# Patient Record
Sex: Male | Born: 2000
Health system: Southern US, Community
[De-identification: ages and names within clinical notes are randomized; demographics above are authoritative.]

## PROBLEM LIST (undated history)

## (undated) DIAGNOSIS — F900 Attention-deficit hyperactivity disorder, predominantly inattentive type: Secondary | ICD-10-CM

## (undated) DIAGNOSIS — Z91018 Allergy to other foods: Secondary | ICD-10-CM

## (undated) DIAGNOSIS — J309 Allergic rhinitis, unspecified: Secondary | ICD-10-CM

## (undated) DIAGNOSIS — J45909 Unspecified asthma, uncomplicated: Secondary | ICD-10-CM

## (undated) DIAGNOSIS — I10 Essential (primary) hypertension: Secondary | ICD-10-CM

## (undated) HISTORY — DX: Unspecified asthma, uncomplicated: J45.909

## (undated) HISTORY — DX: Attention-deficit hyperactivity disorder, predominantly inattentive type: F90.0

## (undated) HISTORY — DX: Allergy to other foods: Z91.018

## (undated) HISTORY — PX: TONSILLECTOMY: SUR1361

## (undated) HISTORY — DX: Essential (primary) hypertension: I10

## (undated) HISTORY — DX: Allergic rhinitis, unspecified: J30.9

---
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
## 2018-01-08 ENCOUNTER — Ambulatory Visit (INDEPENDENT_AMBULATORY_CARE_PROVIDER_SITE_OTHER): Payer: 59 | Admitting: Family Medicine

## 2018-01-08 ENCOUNTER — Encounter: Payer: Self-pay | Admitting: Family Medicine

## 2018-01-08 VITALS — BP 120/70 | HR 90 | Temp 98.2°F | Resp 18

## 2018-01-08 DIAGNOSIS — J301 Allergic rhinitis due to pollen: Secondary | ICD-10-CM | POA: Diagnosis not present

## 2018-01-08 DIAGNOSIS — T7800XD Anaphylactic reaction due to unspecified food, subsequent encounter: Secondary | ICD-10-CM

## 2018-01-08 DIAGNOSIS — T781XXD Other adverse food reactions, not elsewhere classified, subsequent encounter: Secondary | ICD-10-CM | POA: Diagnosis not present

## 2018-01-08 DIAGNOSIS — J4541 Moderate persistent asthma with (acute) exacerbation: Secondary | ICD-10-CM | POA: Diagnosis not present

## 2018-01-08 MED ORDER — FLUTICASONE PROPIONATE HFA 110 MCG/ACT IN AERO: 2.0000 | INHALATION_SPRAY | Freq: Two times a day (BID) | RESPIRATORY_TRACT | 5 refills | Status: DC

## 2018-01-08 MED ORDER — MONTELUKAST SODIUM 10 MG PO TABS: 10.0000 mg | ORAL_TABLET | Freq: Every day | ORAL | 5 refills | Status: DC

## 2018-01-08 NOTE — Progress Notes (Signed)
[address] [place] [address] Dept: [phone]  FOLLOW UP NOTE  Patient ID: Marvin Gates, male    DOB: 07/19/2001  Age: 17 y.o. MRN: [id number] Date of Office Visit: 01/08/2018  Assessment  Chief Complaint: Asthma  HPI Marvin Gates is an 17 year old male who presents to the clinic today for a follow up visit. He was last seen in this clinic on 10/02/2017 by Dr. Beaulah Dinning for evaluation of food allergy, allergic rhinitis, and asthma. At that time, his skin testing was positive to pecan, walnut, cashew, banana, and watermelon.   At today's visit, he reports that he began to experience a sore throat, nasal congestion, and chest tightness that started yesterday morning. He reports he thinks he had a fever last night, however, he is afebrile today in the office. He reports shortness of breath, cough with yellow sputum,  and chest tinghtness with activity and rest. He began using his ProAir inhaler yesterday. He had not been using any inhalers prior to yesterday. He continues taking montelukast 10 mg once a day.   Allergic rhinitis is reported as well controlled, until yesterday, with cetirizine 10 mg once a day as needed. He reports symptoms of thick post nasal drip and slight clear nasal drainage that began yesterday.   He continues to avoid shellfish, watermelon, and banana and has not had any accidental ingestions nor has he needed to use his EpiPen since his last visit to this office. He reports the EpiPen is up to date. He occasionally eats tree nuts with no reaction.   His current medications are listed in the chart.  Drug Allergies:  Allergies  Allergen Reactions  . Banana Itching    Mouth itches  . Other     Tree nuts, positive allergy test  . Shellfish Allergy     Positive allergy test  . Wasp Venom Swelling  . Watermelon [Citrullus Vulgaris]     Positive allergy test    Physical Exam: BP 120/70   Pulse 90   Temp 98.2 F (36.8 C) (Oral)   Resp 18   SpO2 98%      Physical Exam  Constitutional: He is oriented to person, place, and time. He appears well-developed and well-nourished.  HENT:  Head: Normocephalic and atraumatic.  Right Ear: External ear normal.  Left Ear: External ear normal.  Mouth/Throat: Oropharynx is clear and moist.  Bilateral nares slightly erythematous with clear nasal drainage noted. Pharynx normal. Ears normal. Eyes normal.   Eyes: Conjunctivae are normal.  Neck: Normal range of motion. Neck supple.  Cardiovascular: Normal rate, regular rhythm and normal heart sounds.  No murmur noted.  Pulmonary/Chest: Effort normal and breath sounds normal.  Lungs clear to auscultation.  Musculoskeletal: Normal range of motion.  Neurological: He is alert and oriented to person, place, and time.  Skin: Skin is warm and dry.  Psychiatric: He has a normal mood and affect. His behavior is normal. Judgment and thought content normal.    Diagnostics: FVC 6.37, FEV1 5.44. Predicted FVC 6.29, predicted FEV1 5.22. Spirometry is within the normal limit.   Assessment and Plan: 1. Anaphylactic shock due to food, subsequent encounter   2. Moderate persistent asthma with acute exacerbation   3. Allergic rhinitis due to pollen, unspecified seasonality   4. Pollen-food allergy, subsequent encounter     Meds ordered this encounter  Medications  . fluticasone (FLOVENT HFA) 110 MCG/ACT inhaler    Sig: Inhale 2 puffs into the lungs 2 (two)  times daily.    Dispense:  1 Inhaler    Refill:  5  . montelukast (SINGULAIR) 10 MG tablet    Sig: Take 1 tablet (10 mg total) by mouth at bedtime.    Dispense:  30 tablet    Refill:  5    Patient Instructions  Avoid tree nuts, bananas, watermelon. Avoid shellfish until I get the results of your blood work for shellfish allergy. If you have an allergic reaction take Benadryl 50 mg every 4 hours and if you have life-threatening symptoms inject with EpiPen 0.3 mg Zyrtec 10 mg once a day if needed for runny  nose or itchy eyes ProAir 2 puffs every 4 hours if needed for wheezing or coughing spells Continue montelukast 10 mg once a day at bedtime Prednisone 10 mg tablets. Take 2 tablets twice a day for 3 days, take 2 tablets for 1 day, then take 1 tablet on the 5th day, then stop. Begin Flovent 110- 2 puffs every 12 hours to prevent cough or wheeze. Call me if you are not doing well on this treatment plan I will call you with the results of your blood work for shellfish allergy Follow up in 3 months or sooner if needed   Return in about 3 months (around 04/10/2018), or if symptoms worsen or fail to improve.   Thank you for the opportunity to care for this patient.  Please do not hesitate to contact me with questions.  Thermon Leyland, FNP [facility]  I have provided oversight concerning Thermon Leyland' evaluation and treatment of this patient's health issues addressed during today's encounter. I agree with the assessment and therapeutic plan as outlined in the note.   Thank you for the opportunity to care for this patient.  Please do not hesitate to contact me with questions.  Tonette Bihari, M.D.  [facility] [address] [place], [place] [address] [phone]

## 2018-01-08 NOTE — Patient Instructions (Addendum)
Avoid tree nuts, bananas, watermelon. Avoid shellfish until I get the results of your blood work for shellfish allergy. If you have an allergic reaction take Benadryl 50 mg every 4 hours and if you have life-threatening symptoms inject with EpiPen 0.3 mg Zyrtec 10 mg once a day if needed for runny nose or itchy eyes ProAir 2 puffs every 4 hours if needed for wheezing or coughing spells Continue montelukast 10 mg once a day at bedtime Prednisone 10 mg tablets. Take 2 tablets twice a day for 3 days, take 2 tablets for 1 day, then take 1 tablet on the 5th day, then stop. Begin Flovent 110- 2 puffs every 12 hours to prevent cough or wheeze. Call me if you are not doing well on this treatment plan I will call you with the results of your blood work for shellfish allergy Follow up in 3 months or sooner if needed

## 2018-01-12 ENCOUNTER — Encounter: Payer: Self-pay | Admitting: Allergy & Immunology

## 2018-01-12 ENCOUNTER — Ambulatory Visit: Payer: 59 | Admitting: Allergy & Immunology

## 2018-01-12 VITALS — BP 140/82 | HR 88 | Temp 97.9°F | Resp 20

## 2018-01-12 DIAGNOSIS — J4541 Moderate persistent asthma with (acute) exacerbation: Secondary | ICD-10-CM

## 2018-01-12 DIAGNOSIS — T7800XD Anaphylactic reaction due to unspecified food, subsequent encounter: Secondary | ICD-10-CM | POA: Diagnosis not present

## 2018-01-12 DIAGNOSIS — J301 Allergic rhinitis due to pollen: Secondary | ICD-10-CM | POA: Diagnosis not present

## 2018-01-12 DIAGNOSIS — J029 Acute pharyngitis, unspecified: Secondary | ICD-10-CM

## 2018-01-12 MED ORDER — AMOXICILLIN 875 MG PO TABS: 875.0000 mg | ORAL_TABLET | Freq: Two times a day (BID) | ORAL | 0 refills | Status: AC

## 2018-01-12 NOTE — Progress Notes (Signed)
FOLLOW UP  Date of Service/Encounter:  01/12/18   Assessment:   Pharyngitis - possible Streptococcal  Anaphylactic shock due to food (banana, ? shellfish)  Moderate persistent asthma with acute exacerbation  Allergic rhinitis due to pollen  Plan/Recommendations:   1. Pharyngitis - You did have white exudates in the back of the throat, so we will go ahead and treat with amoxicillin twice daily for ten days.  - Increase yogurt intake while you are on the antibiotic. - Restart prednisone  (six tablets) daily for three days.  - Continue with albuterol, but increase to four puffs every 4-6 hours.  - Continue with all of your other medications.  - I did offer a strep test, but he declined. - He does have a girlfriend, therefore if there is no improvement with the amoxicillin, we could consider Gonococcal pharyngitis as an etiology.    2. Persistent asthma - Continue with montelukast  daily. - Continue with Flovent 110 mcg 2 puffs twice daily. - Continue with albuterol 4 puffs every 4-6 hours as needed.  3. Allergic rhinitis - Continue with cetirizine 10 mg daily.  4. Multiple food allergies - We did go through his food allergy list and removed ones that were clearly false positives. - He is eating tree nuts without any issues, so I removed this. - He is also eating watermelon without adverse event, therefore this was removed. - He does have reactions to bananas, therefore I left this on. - We also getting the shellfish panel to more clearly define his food allergies.  5. Return in about 1 year (around 01/13/2019).  Subjective:   Marvin Gates is a 17 y.o. male presenting today for follow up of  Chief Complaint  Patient presents with  . Asthma    lots of post nasal drainage, dark yellow sinus drainage, dark yellow mucus with cough, chills, low grade fever 99.8 last night. He did take some Aleve this morning.     MOHSEN Gates has a history of the  following: Patient Active Problem List   Diagnosis Date Noted  . Pollen-food allergy 10/02/2017  . Anaphylactic shock due to adverse food reaction 09/04/2017  . BMI 95th percentile or greater with athletic build, pediatric 05/23/2016  . Mild intermittent asthma without complication 05/09/2016  . Insect sting allergy, current reaction 05/09/2016  . History of insect sting allergy 03/29/2016  . Allergic rhinitis due to pollen 03/29/2016  . Acute non-recurrent pansinusitis 03/29/2016  . Hypertension 11/20/2015  . Moderate persistent asthma with acute exacerbation 06/15/2015  . Other allergic rhinitis 06/15/2015    History obtained from: chart review and patient and his father.  [facility] Primary Care Provider is Chapman Moss, MD.     Marvin Gates is a 17 y.o. male presenting for a sick visit.  He was last seen 4 days ago for shortness of breath, sore throat, and yellow sputum production.  At that time, he was started on a low-dose prednisone burst.  He was also started on Flovent 110 mcg 2 puffs twice daily.  He also has a history of multiple food allergies as well as allergic rhinitis.  He was continued on Zyrtec 10 mg daily as well as montelukast and milligrams.  He has a history of allergies to tree nuts, bananas, watermelon, and possibly shellfish (blood work pending).   Since the last visit, he has mostly done well.  He did start to feel better on the prednisone.  He remained on the  albuterol 2 puffs every 4-6 hours me today that he is only been using it once a day.  In any case, he started to feel bad again a couple of days ago with increased congestion, rhinorrhea, and sore throat.  He has not had a fever.  Unfortunately, they lost some of their prednisone pack, as it rolled behind the stove.  So he has not been on the prednisone for 3 days approximately.  Prior to this last week, his asthma had been under good control with Singulair daily only.  Despite his marked physical activity  as a [place], he really has no symptoms whatsoever.  There are no known sick contacts.  He would also like to get his blood work done today.  A shellfish panel was ordered on Monday, but our phlebotomist was not in.  We will get this done today.  He is very confused about his food allergy testing from February.  Evidently, it showed positives to tree nuts.  However, he eats cashews all of the time multiple times per week.  He also eats macadamia nuts stat she has an peanuts.  He does not really eat pecans and walnuts, but has eaten them on rare occasions without adverse event.  He also was positive to watermelon but eats watermelon on a regular basis.  Review of the note from February shows that he had the entire food panel done, which evidently showed quite a few false positives.  He does think that he reacted to scallops, which was slightly reactive on the skin test.  This was supposed to be clarified with the shellfish panel.  He eats cashews all of time, many times per week. He eats macadamia nuts, pistachios, and peanuts. He does not really eat pecans and walnuts, but has eaten them in the past. He eats watermelon all of the time.   Otherwise, there have been no changes to his past medical history, surgical history, family history, or social history.    Review of Systems: a 14-point review of systems is pertinent for what is mentioned in HPI.  Otherwise, all other systems were negative. Constitutional: negative other than that listed in the HPI Eyes: negative other than that listed in the HPI Ears, nose, mouth, throat, and face: negative other than that listed in the HPI Respiratory: negative other than that listed in the HPI Cardiovascular: negative other than that listed in the HPI Gastrointestinal: negative other than that listed in the HPI Genitourinary: negative other than that listed in the HPI Integument: negative other than that listed in the HPI Hematologic: negative other  than that listed in the HPI Musculoskeletal: negative other than that listed in the HPI Neurological: negative other than that listed in the HPI Allergy/Immunologic: negative other than that listed in the HPI    Objective:   Blood pressure 140/82, pulse 88, temperature 97.9 F (36.6 C), temperature source Oral, resp. rate 20, SpO2 98 %. There is no height or weight on file to calculate BMI.   Physical Exam:  General: Alert, interactive, in no acute distress. Pleasant. Breathing through his mouth.  Eyes: No conjunctival injection bilaterally, no discharge on the right, no discharge on the left and no Horner-Trantas dots present. PERRL bilaterally. EOMI without pain. No photophobia.  Ears: Right TM pearly gray with normal light reflex, Left TM pearly gray with normal light reflex, Right TM intact without perforation and Left TM intact without perforation.  Nose/Throat: External nose within normal limits and septum midline. Turbinates edematous  and pale with clear discharge. Posterior oropharynx erythematous with cobblestoning in the posterior oropharynx. Tonsils surgically absent.  He does have an ulcer on the back part of his soft palate as well as exudates in his posterior oropharynx.  There is market postnasal drip present as well. Lungs: Decreased breath sounds bilaterally without wheezing, rhonchi or rales. No increased work of breathing. CV: Normal S1/S2. No murmurs. Capillary refill <2 seconds.  Skin: Warm and dry, without lesions or rashes. Neuro:   Grossly intact. No focal deficits appreciated. Responsive to questions.  Diagnostic studies: none      Malachi Bonds, MD  [facility]

## 2018-01-12 NOTE — Patient Instructions (Addendum)
1. Pharyngitis - You did have white exudates in the back of the throat, so we will go ahead and treat with amoxicillin twice daily for ten days.  - Increase yogurt intake while you are on the antibiotic. - Restart prednisone  (six tablets) daily for three days.  - Continue with albuterol, but increase to four puffs every 4-6 hours.  - Continue with all of your other medications.   2. Return in about 1 year (around 01/13/2019).   Please inform us of any Emergency Department visits, hospitalizations, or changes in symptoms. Call us before going to the ED for breathing or allergy symptoms since we might be able to fit you in for a sick visit. Feel free to contact us anytime with any questions, problems, or concerns.  It was a pleasure to meet you and your family today!   Websites that have reliable patient information: 1. American Academy of Asthma, Allergy, and Immunology: www.aaaai.org 2. Food Allergy Research and Education (FARE): foodallergy.org 3. Mothers of Asthmatics: http://www.asthmacommunitynetwork.org 4. American College of Allergy, Asthma, and Immunology: www.acaai.org

## 2018-01-20 LAB — ALLERGEN PROFILE, SHELLFISH
CLAM IGE: 0.28 kU/L — AB
F023-IgE Crab: 0.22 kU/L — AB
F080-IgE Lobster: 0.12 kU/L — AB
F290-IgE Oyster: 0.41 kU/L — AB
SCALLOP IGE: 0.42 kU/L — AB
SHRIMP IGE: 0.16 kU/L — AB

## 2018-02-07 ENCOUNTER — Encounter: Payer: Self-pay | Admitting: Family Medicine

## 2018-02-07 ENCOUNTER — Ambulatory Visit (INDEPENDENT_AMBULATORY_CARE_PROVIDER_SITE_OTHER): Payer: 59 | Admitting: Family Medicine

## 2018-02-07 VITALS — BP 132/70 | HR 82 | Temp 98.0°F | Ht 76.0 in | Wt 298.2 lb

## 2018-02-07 DIAGNOSIS — Z0000 Encounter for general adult medical examination without abnormal findings: Secondary | ICD-10-CM

## 2018-02-07 DIAGNOSIS — Z23 Encounter for immunization: Secondary | ICD-10-CM | POA: Diagnosis not present

## 2018-02-07 DIAGNOSIS — Z130 Encounter for screening for diseases of the blood and blood-forming organs and certain disorders involving the immune mechanism: Secondary | ICD-10-CM

## 2018-02-07 LAB — LIPID PANEL
CHOL/HDL RATIO: 7
Cholesterol: 207 mg/dL — ABNORMAL HIGH (ref 0–200)
HDL: 28.2 mg/dL — ABNORMAL LOW (ref >39.00)
NONHDL: 179.04
Triglycerides: 270 mg/dL — ABNORMAL HIGH (ref 0.0–149.0)
VLDL: 54 mg/dL — ABNORMAL HIGH (ref 0.0–40.0)

## 2018-02-07 LAB — COMPREHENSIVE METABOLIC PANEL
ALK PHOS: 64 U/L (ref 52–171)
ALT: 16 U/L (ref 0–53)
AST: 15 U/L (ref 0–37)
Albumin: 4.7 g/dL (ref 3.5–5.2)
BILIRUBIN TOTAL: 0.6 mg/dL (ref 0.3–1.2)
BUN: 16 mg/dL (ref 6–23)
CO2: 30 meq/L (ref 19–32)
Calcium: 10 mg/dL (ref 8.4–10.5)
Chloride: 101 mEq/L (ref 96–112)
Creatinine, Ser: 1.02 mg/dL (ref 0.40–1.50)
GFR: 100.43 mL/min (ref >60.00)
Glucose, Bld: 97 mg/dL (ref 70–99)
POTASSIUM: 4.5 meq/L (ref 3.5–5.1)
SODIUM: 138 meq/L (ref 135–145)
TOTAL PROTEIN: 6.8 g/dL (ref 6.0–8.3)

## 2018-02-07 LAB — LDL CHOLESTEROL, DIRECT: Direct LDL: 144 mg/dL

## 2018-02-07 NOTE — Progress Notes (Signed)
Chief Complaint  Patient presents with  . New Patient (Initial Visit)    Well Male Marvin Gates is here for a complete physical.   His last physical was >1 year ago.  Current diet: in general, a "healthy" diet.   Current exercise: lifting weights, conditioning for football Weight trend: lost a little Seat belt? Yes.    Health maintenance Tetanus- unsure  Past Medical History:  Diagnosis Date  . Allergic rhinitis   . Asthma   . Food allergy      Past Surgical History:  Procedure Laterality Date  . TONSILLECTOMY      Medications  Current Outpatient Medications on File Prior to Visit  Medication Sig Dispense Refill  . albuterol (PROAIR HFA) 108 (90 Base) MCG/ACT inhaler Inhale 2 puffs into the lungs every 4 (four) hours as needed for wheezing or shortness of breath. 2 Inhaler 1  . cetirizine (ZYRTEC) 10 MG tablet Take 10 mg by mouth daily.    . enalapril (VASOTEC) 10 MG tablet Take 10 mg by mouth daily.    Marland Kitchen. EPINEPHrine (EPIPEN 2-PAK) 0.3 mg/0.3 mL IJ SOAJ injection Use as directed for severe allergic reaction 4 Device 2  . montelukast (SINGULAIR) 10 MG tablet Take 1 tablet (10 mg total) by mouth at bedtime. 30 tablet 5   Allergies Allergies  Allergen Reactions  . Banana Itching    Mouth itches  . Shellfish Allergy     Positive allergy test  . Wasp Venom Swelling   Family History Family History  Problem Relation Age of Onset  . Asthma Mother   . Allergic rhinitis Mother   . Food Allergy Mother        shellfish allergy  . Angioedema Neg Hx   . Eczema Neg Hx   . Immunodeficiency Neg Hx   . Urticaria Neg Hx    Review of Systems: Constitutional: no fevers or chills Eye:  no recent significant change in vision Ear/Nose/Mouth/Throat:  Ears:  no tinnitus or hearing loss Nose/Mouth/Throat:  no complaints of nasal congestion, no sore throat Cardiovascular:  no chest pain, no palpitations Respiratory:  no cough and no shortness of breath Gastrointestinal:  no  abdominal pain, no change in bowel habits GU:  Male: negative for dysuria, frequency, and incontinence and negative for prostate symptoms Musculoskeletal/Extremities:  no pain, redness, or swelling of the joints Integumentary (Skin/Breast):  no abnormal skin lesions reported Neurologic:  no headaches, no numbness, tingling Endocrine: No unexpected weight changes Hematologic/Lymphatic:  no night sweats  Exam BP 132/70 (BP Location: Left Arm, Patient Position: Sitting, Cuff Size: Large)   Pulse 82   Temp 98 F (36.7 C) (Oral)   Ht 6\' 4"  (1.93 m)   Wt 298 lb 4 oz (135.3 kg)   SpO2 97%   BMI 36.30 kg/m  General:  well developed, well nourished, in no apparent distress Skin:  no significant moles, warts, or growths Head:  no masses, lesions, or tenderness Eyes:  pupils equal and round, sclera anicteric without injection Ears:  canals without lesions, TMs shiny without retraction, no obvious effusion, no erythema Nose:  nares patent, septum midline, mucosa normal Throat/Pharynx:  lips and gingiva without lesion; tongue and uvula midline; non-inflamed pharynx; no exudates or postnasal drainage Neck: neck supple without adenopathy, thyromegaly, or masses Lungs:  clear to auscultation, breath sounds equal bilaterally, no respiratory distress Cardio:  regular rate and rhythm, no bruits, no LE edema Abdomen:  abdomen soft, nontender; bowel sounds normal; no masses or organomegaly  Genital (male): Uncircumcised penis, no lesions or discharge; testes present bilaterally without masses or tenderness Rectal: Deferred Musculoskeletal:  symmetrical muscle groups noted without atrophy or deformity Extremities:  no clubbing, cyanosis, or edema, no deformities, no skin discoloration Neuro:  gait normal; deep tendon reflexes normal and symmetric Psych: well oriented with normal range of affect and appropriate judgment/insight  Assessment and Plan  Well adult exam - Plan: Lipid panel, Comprehensive  metabolic panel  Need for Tdap vaccination - Plan: Tdap vaccine greater than or equal to 7yo IM  Need for HPV vaccination - Plan: HPV 9-valent vaccine,Recombinat  Need for meningitis vaccination - Plan: Meningococcal MCV4O(Menveo)  Encounter for sickle-cell screening - Plan: Sickle Cell Scr   Well 17 y.o. male. Counseled on diet and exercise. Doing well.  Form for sports team filled out, cleared w/o restrictions. Other orders as above. Update some imms, per dad, he had all his childhood vaccinations. Will await records.  Follow up in 1 year pending the above workup. The patient voiced understanding and agreement to the plan.  Jilda Roche Hastings, DO 02/07/18 11:56 AM

## 2018-02-07 NOTE — Progress Notes (Signed)
Pre visit review using our clinic review tool, if applicable. No additional management support is needed unless otherwise documented below in the visit note. 

## 2018-02-07 NOTE — Patient Instructions (Signed)
Stay active, keep the diet clean.  Give us 2-3 business days to get the results of your labs back. If labs are normal, you will likely receive a letter in the mail unless you have MyChart. This can take longer than 2-3 business days.   We have made a copy of your sports physical form in case anything happens.  Once we get your records, if anything needs to be updated regarding your immunizations, we will reach out to you.  Let us know if you need anything.

## 2018-02-08 ENCOUNTER — Other Ambulatory Visit: Payer: Self-pay | Admitting: Family Medicine

## 2018-02-08 DIAGNOSIS — H6123 Impacted cerumen, bilateral: Secondary | ICD-10-CM | POA: Diagnosis not present

## 2018-02-08 DIAGNOSIS — E785 Hyperlipidemia, unspecified: Secondary | ICD-10-CM

## 2018-02-08 LAB — SICKLE CELL SCREEN: SICKLE SOLUBILITY TEST - HGBRFX: NEGATIVE

## 2018-02-15 ENCOUNTER — Other Ambulatory Visit: Payer: Self-pay

## 2018-02-15 ENCOUNTER — Telehealth: Payer: Self-pay

## 2018-02-15 MED ORDER — PREDNISONE 10 MG PO TABS: 10.0000 mg | ORAL_TABLET | Freq: Every day | ORAL | 0 refills | Status: DC

## 2018-02-15 NOTE — Telephone Encounter (Signed)
Sent in rx and called and informed pt

## 2018-02-15 NOTE — Telephone Encounter (Signed)
Patient's mom called to advise that Marvin Gates had an allergic reaction just now. He consumed crab and immediately began having throat tightness, itching, sneezing, sinus congestion. Mom gave him benadryl as directed and he started feeling better. Still having some residual sinus congestion, and feels like he has just come down with a terrible cold. I advised to give 2 more benadryl and if no better to use EpiPen then seek medical attention immediately. Mom wondered if there was anything that we could send in to help him out. [place]. [facility] [phone](phone number for pharmacy)

## 2018-02-15 NOTE — Telephone Encounter (Signed)
Please call in prednisone 20mg  today, tomorrow, and the next day.

## 2018-02-19 ENCOUNTER — Telehealth: Payer: Self-pay

## 2018-02-19 NOTE — Telephone Encounter (Signed)
Mom called and stated that patient had a reaction last week to seafood and prednisone was sent in and now patient is experiencing some wheezing and coughing up yellow stuff. An appt was offered for this morning however mom stated they were on their way to [place] and could not make it this morning. A 130 appt was offered and mom stated she could not get him here by then that it would have to be later in the day before she could get him back. I advised mom that we didn't have any openings in the time frame she was looking for and asked if patient was taking his Flovent 2 puffs bid and mom stated yes and I asked if he had done his rescue inhaler and mom stated yes he finally use it a short time ago and I advised mom for patient to continue to use his inhalers and informed her that we have appts tomorrow if she would like one of those or if she wanted to take him to an urgent care if it could not wait. Mom stated that she was heading to [place] and thought it could wait until tomorrow and wanted to be seen in [place] unless there wasn't any appt available then [place] would come [place]. Patient has appt tomorrow at 2:45 in [place].

## 2018-02-20 ENCOUNTER — Ambulatory Visit: Payer: 59 | Admitting: Pediatrics

## 2018-02-20 ENCOUNTER — Encounter: Payer: Self-pay | Admitting: Pediatrics

## 2018-02-20 VITALS — BP 128/88 | HR 84 | Temp 98.4°F | Resp 20

## 2018-02-20 DIAGNOSIS — T7800XD Anaphylactic reaction due to unspecified food, subsequent encounter: Secondary | ICD-10-CM | POA: Diagnosis not present

## 2018-02-20 DIAGNOSIS — J0100 Acute maxillary sinusitis, unspecified: Secondary | ICD-10-CM

## 2018-02-20 DIAGNOSIS — J301 Allergic rhinitis due to pollen: Secondary | ICD-10-CM | POA: Diagnosis not present

## 2018-02-20 DIAGNOSIS — J4540 Moderate persistent asthma, uncomplicated: Secondary | ICD-10-CM | POA: Diagnosis not present

## 2018-02-20 MED ORDER — AMOXICILLIN-POT CLAVULANATE 875-125 MG PO TABS
ORAL_TABLET | ORAL | 0 refills | Status: DC
Start: 2018-02-20 — End: 2018-08-27

## 2018-02-20 NOTE — Patient Instructions (Addendum)
Cetirizine 10 mg-take 1 tablet once a day for runny nose Fluticasone  2 sprays per nostril once a day for stuffy nose Flovent 110 - 2 puffs twice a day to prevent coughing or wheezing Montelukast 10 mg - 1 tablet once a day to prevent coughing or wheezing Pro-air-2 puffs every 4 hours if needed for wheezing or coughing spells Augmentin 875 mg-take 1 tablet every 12 hours for infection for 10 days Prednisone 20 mg twice a day for 3 days, 20 mg on the fourth day, 10 mg on the fifth day Call us if he is not doing well on this treatment plan  Avoid shellfish and bananas.  If he has an allergic reaction take Benadryl 50 mg every 4 hours and if he has life-threatening symptoms inject with EpiPen 0.3 mg

## 2018-02-20 NOTE — Progress Notes (Signed)
  [address] [place] [address] Dept: [phone]  FOLLOW UP NOTE  Patient ID: Marvin Gates, male    DOB: 08/24/2001  Age: 17 y.o. MRN: [id number] Date of Office Visit: 02/20/2018  Assessment  Chief Complaint: Sinusitis and Allergic Reaction (10 days ago to crab)  HPI Marvin HuntsmanJacob D Gates presents for evaluation of worsening sinus problems after he had an allergic reaction to crab on 02/10/2018.  He developed swelling of his throat and shortness of breath within 30 minutes of eating crab.  He is allergic to shellfish.  He avoids bananas and shellfish.  His asthma has been well controlled.  He has developed sinus pressure, headache, and  green drainage from his sinuses   Drug Allergies:  Allergies  Allergen Reactions  . Banana Itching    Mouth itches  . Shellfish Allergy     Positive allergy test  . Wasp Venom Swelling    Physical Exam: BP 128/88   Pulse 84   Temp 98.4 F (36.9 C) (Oral)   Resp 20   SpO2 97%    Physical Exam  Constitutional: He is oriented to person, place, and time. He appears well-developed and well-nourished.  HENT:  Eyes normal.  Ears normal.  Nose mild swelling of the nasal turbinates Pharynx normal except for a green postnasal drainage  Neck: Neck supple.  Cardiovascular:  S1-S2 normal no murmurs  Pulmonary/Chest:  Clear to percussion and auscultation  Lymphadenopathy:    He has no cervical adenopathy.  Neurological: He is alert and oriented to person, place, and time.  Psychiatric: He has a normal mood and affect. His behavior is normal. Judgment and thought content normal.  Vitals reviewed.   Diagnostics: FVC 6.50 L FEV1 5.63 L.  Predicted FVC 6.29 L predicted FEV1 5.22 L-the spirometry is in the normal range  Assessment and Plan: 1. Anaphylactic shock due to food, subsequent encounter   2. Moderate persistent asthma without complication   3. Acute maxillary sinusitis, recurrence not specified   4. Seasonal allergic rhinitis due to  pollen     Meds ordered this encounter  Medications  . amoxicillin-clavulanate (AUGMENTIN) 875-125 MG tablet    Sig: Take 1 tablet every 12 hours for 10 days for infection    Dispense:  20 tablet    Refill:  0    Patient Instructions  Cetirizine 10 mg-take 1 tablet once a day for runny nose Fluticasone  2 sprays per nostril once a day for stuffy nose Flovent 110 - 2 puffs twice a day to prevent coughing or wheezing Montelukast 10 mg - 1 tablet once a day to prevent coughing or wheezing Pro-air-2 puffs every 4 hours if needed for wheezing or coughing spells Augmentin 875 mg-take 1 tablet every 12 hours for infection for 10 days Prednisone 20 mg twice a day for 3 days, 20 mg on the fourth day, 10 mg on the fifth day Call us if he is not doing well on this treatment plan  Avoid shellfish and bananas.  If he has an allergic reaction take Benadryl 50 mg every 4 hours and if he has life-threatening symptoms inject with EpiPen 0.3 mg    Return in about 6 months (around 08/22/2018).    Thank you for the opportunity to care for this patient.  Please do not hesitate to contact me with questions.  Tonette BihariJ. A. Bardelas, M.D.  [facility] [address] [place], [place] [address] [phone]

## 2018-02-21 ENCOUNTER — Telehealth: Payer: Self-pay | Admitting: Family Medicine

## 2018-02-21 ENCOUNTER — Other Ambulatory Visit: Payer: Self-pay | Admitting: Allergy

## 2018-02-21 ENCOUNTER — Other Ambulatory Visit (INDEPENDENT_AMBULATORY_CARE_PROVIDER_SITE_OTHER): Payer: 59

## 2018-02-21 DIAGNOSIS — E785 Hyperlipidemia, unspecified: Secondary | ICD-10-CM | POA: Diagnosis not present

## 2018-02-21 LAB — LIPID PANEL
CHOL/HDL RATIO: 6
Cholesterol: 225 mg/dL — ABNORMAL HIGH (ref 0–200)
HDL: 37.9 mg/dL — ABNORMAL LOW (ref >39.00)
LDL CALC: 161 mg/dL — AB (ref 0–99)
NonHDL: 187.03
TRIGLYCERIDES: 128 mg/dL (ref 0.0–149.0)
VLDL: 25.6 mg/dL (ref 0.0–40.0)

## 2018-02-21 MED ORDER — ALBUTEROL SULFATE HFA 108 (90 BASE) MCG/ACT IN AERS: 2.0000 | INHALATION_SPRAY | RESPIRATORY_TRACT | 0 refills | Status: DC | PRN

## 2018-02-21 NOTE — Telephone Encounter (Signed)
Called left message to call back 

## 2018-02-21 NOTE — Addendum Note (Signed)
Addended by: Berna BueWHITAKER, CARRIE L on: 02/21/2018 08:09 AM   Modules accepted: Orders

## 2018-02-21 NOTE — Telephone Encounter (Signed)
Copied from CRM [id number]. Topic: General - Other >> Feb 20, 2018  3:16 PM Terisa Starraylor, Brittany L wrote: Reason for CRM: Patient's mom needs a copy of his immunizations for his college records. She contacted his previous his pediatrician office and they told her they already sent this to Dr Carmelia RollerWendling. Please call when that is ready for pick up. She needs this by Friday. [phone]   Printed report from [facility]/immunization report in chart as well///Is in an envelope at the front desk ready for pickup. Called the number listed left detailed message ready for pickup.

## 2018-02-21 NOTE — Telephone Encounter (Signed)
Copied from CRM [phone]. Topic: Quick Communication - See Telephone Encounter >> Feb 21, 2018  4:18 PM Tamela OddiMartin, Don'Quashia, NT wrote: CRM for notification. See Telephone encounter for: 02/21/18. Patient mother called and states that his previous doctor prescribed him enalapril (VASOTEC) 10 MG tablet.and she is wanting to talk to the provider to see if he will need to continue to take this medication. If so he needs refills. She is requesting a call back pertaining to this medication .  CB# [phone]  [facility] [id number] - [place] - [address] [facility] [address] [phone] (Phone) [phone] (Fax)

## 2018-02-21 NOTE — Telephone Encounter (Signed)
Copied from CRM [id number]. Topic: General - Other >> Feb 20, 2018  3:16 PM Terisa Starraylor, Brittany L wrote: Reason for CRM: Patient's mom needs a copy of his immunizations for his college records. She contacted his previous his pediatrician office and they told her they already sent this to Dr Carmelia RollerWendling. Please call when that is ready for pick up. She needs this by Friday. [phone]  >> Feb 21, 2018 10:31 AM Tamela OddiHarris, Brenda J wrote: Patient called to request a copy of his Sickle Cell Results.  He said he picked up his immunization records but it did not include the Sickle Cell Results.  He stated that he could pick those up as well and he needs it today.  CB# [phone].  Called informed lab results ready for pickup at the front desk.

## 2018-02-21 NOTE — Telephone Encounter (Signed)
Copied from CRM [id number]. Topic: General - Other >> Feb 20, 2018  3:16 PM Terisa Starraylor, Brittany L wrote: Reason for CRM: Patient's mom needs a copy of his immunizations for his college records. She contacted his previous his pediatrician office and they told her they already sent this to Dr Carmelia RollerWendling. Please call when that is ready for pick up. She needs this by Friday. [phone]  >> Feb 21, 2018 10:31 AM Tamela OddiHarris, Brenda J wrote: Patient called to request a copy of his Sickle Cell Results.  He said he picked up his immunization records but it did not include the Sickle Cell Results.  He stated that he could pick those up as well and he needs it today.  CB# [phone].  Copied lab results///patient contacted to pickup at the front desk.

## 2018-02-21 NOTE — Telephone Encounter (Signed)
I would prefer he go on amlodipine given his athletic endeavors and what it could do to his kidneys. We can stop altogether if they have a way to check at home.

## 2018-02-21 NOTE — Telephone Encounter (Signed)
Mom says they will switch to Amlodipine and monitor at home. Please call back if any additional questions or concerns.

## 2018-02-22 MED ORDER — AMLODIPINE BESYLATE 5 MG PO TABS: 5.0000 mg | ORAL_TABLET | Freq: Every day | ORAL | 3 refills | Status: DC

## 2018-02-22 NOTE — Addendum Note (Signed)
Addended by: Radene GunningWENDLING, Breea Loncar P on: 02/22/2018 07:39 AM   Modules accepted: Orders

## 2018-02-22 NOTE — Telephone Encounter (Signed)
Mom ok to change to amlodipine

## 2018-02-23 ENCOUNTER — Ambulatory Visit (INDEPENDENT_AMBULATORY_CARE_PROVIDER_SITE_OTHER): Payer: 59

## 2018-02-23 ENCOUNTER — Telehealth: Payer: Self-pay | Admitting: Family Medicine

## 2018-02-23 DIAGNOSIS — Z23 Encounter for immunization: Secondary | ICD-10-CM | POA: Diagnosis not present

## 2018-02-23 NOTE — Telephone Encounter (Signed)
Contacted ok and scheduled nurse visit today at 3 PM 02/23/2018

## 2018-02-23 NOTE — Telephone Encounter (Signed)
Copied from [place] [id number]. Topic: Inquiry >> Feb 23, 2018 10:52 AM Conception Chancy, NT wrote: Reason for CRM: patient mother is calling and states patient is going off to [facility] in August and he is needing to get 2 vaccinations. Hep B and MMR. Please contact to schedule   [phone]

## 2018-02-23 NOTE — Progress Notes (Signed)
Pre visit review using our clinic review tool, if applicable. No additional management support is needed unless otherwise documented below in the visit note.  Patient originally here for mmr and hep b vaccine but after looking in [facility] patient needed meningitis vaccines. UTD on others. Per Dr. Nani Ravens give patient men B in right deltoid IM and Meningococcal disease in left deltoid IM.  Patient tolerated well. Vis given.

## 2018-02-23 NOTE — Telephone Encounter (Signed)
OK to do. TY.  

## 2018-03-02 ENCOUNTER — Telehealth: Payer: Self-pay | Admitting: *Deleted

## 2018-03-02 NOTE — Telephone Encounter (Signed)
[facility]; forwarded to provider/SLS 07/05

## 2018-06-13 ENCOUNTER — Ambulatory Visit: Payer: 59 | Admitting: Family Medicine

## 2018-07-02 ENCOUNTER — Other Ambulatory Visit: Payer: Self-pay | Admitting: Family Medicine

## 2018-07-02 MED ORDER — AMLODIPINE BESYLATE 5 MG PO TABS: 5.0000 mg | ORAL_TABLET | Freq: Every day | ORAL | 2 refills | Status: DC

## 2018-07-02 NOTE — Telephone Encounter (Signed)
Copied from CRM [id number]. Topic: Quick Communication - Rx Refill/Question >> Jul 02, 2018 12:29 PM Angela Nevin wrote: Medication: amLODipine (NORVASC) 5 MG tablet [[id number]]   Pts father (on [place]) called requesting a refill of this medication on behalf of pt. Requesting refill be sent to pharmacy below.  Preferred Pharmacy (with phone number or street name):  [facility]  [address]  [place], [place] [address] [place]  Phone: [phone]

## 2018-08-15 ENCOUNTER — Other Ambulatory Visit: Payer: Self-pay | Admitting: Family Medicine

## 2018-08-27 ENCOUNTER — Ambulatory Visit (INDEPENDENT_AMBULATORY_CARE_PROVIDER_SITE_OTHER): Payer: 59 | Admitting: Pediatrics

## 2018-08-27 ENCOUNTER — Encounter: Payer: Self-pay | Admitting: Pediatrics

## 2018-08-27 VITALS — BP 134/70 | HR 90 | Temp 98.3°F | Resp 20 | Ht 74.0 in | Wt 290.6 lb

## 2018-08-27 DIAGNOSIS — T7800XD Anaphylactic reaction due to unspecified food, subsequent encounter: Secondary | ICD-10-CM

## 2018-08-27 DIAGNOSIS — I10 Essential (primary) hypertension: Secondary | ICD-10-CM

## 2018-08-27 DIAGNOSIS — J4530 Mild persistent asthma, uncomplicated: Secondary | ICD-10-CM

## 2018-08-27 DIAGNOSIS — J301 Allergic rhinitis due to pollen: Secondary | ICD-10-CM

## 2018-08-27 NOTE — Progress Notes (Signed)
[address] [place] [id number] Dept: [phone]  FOLLOW UP NOTE  Patient ID: Marvin Gates Scavone, male    DOB: 02/13/2001  Age: 17 y.o. MRN: [id number] Date of Office Visit: 08/27/2018  Assessment  Chief Complaint: Asthma (doing well.) and Allergic Rhinitis   HPI Marvin Gates Tay presents for follow-up of asthma, allergic rhinitis and food allergies.  His asthma is well controlled but he has been using Pro-air 2 puffs twice a day instead of Flovent 110-2 puffs twice a day.  He is allergic to scallops but would like to know if he can eat shrimp and crab.  He had negative skin test to shrimp and crab.  In May he had class 0/I positive IgE to  clam, crab, shrimp and lobster , and a class I KU per liter IgE to scallop and oyster .  He is on montelukast 10 mg once a day.  He is not having any significant nasal congestion   Drug Allergies:  Allergies  Allergen Reactions  . Banana Itching    Mouth itches  . Shellfish Allergy     Positive allergy test  . Wasp Venom Swelling    Physical Exam: BP 134/70 (BP Location: Right Arm, Patient Position: Sitting, Cuff Size: Large)   Pulse 90   Temp 98.3 F (36.8 C) (Oral)   Resp 20   Ht 6\' 2"  (1.88 m)   Wt 290 lb 9.6 oz (131.8 kg)   SpO2 97%   BMI 37.31 kg/m    Physical Exam Vitals signs reviewed.  Constitutional:      Appearance: Normal appearance. He is normal weight.  HENT:     Head:     Comments: Eyes normal.  Ears normal.  Nose normal.  Pharynx normal. Neck:     Musculoskeletal: Neck supple.  Cardiovascular:     Comments: S1-S2 normal no murmurs Pulmonary:     Comments: Clear to percussion and auscultation Lymphadenopathy:     Cervical: No cervical adenopathy.  Skin:    Comments: Clear  Neurological:     General: No focal deficit present.     Mental Status: He is alert and oriented to person, place, and time.  Psychiatric:        Mood and Affect: Mood normal.        Behavior: Behavior normal.        Thought Content:  Thought content normal.        Judgment: Judgment normal.     Diagnostics: FVC 6.57 L FEV1 5.68 L.  Predicted FVC 6.11 L predicted FEV1 5.07 L-the spirometry is in the normal range  Assessment and Plan: 1. Anaphylactic reaction due to food, subsequent encounter   2. Mild persistent asthma without complication   3. Seasonal allergic rhinitis due to pollen   4. Essential hypertension        Patient Instructions  Cetirizine 10 mg-take 1 tablet once a day if needed for runny nose or itchy eyes Fluticasone 2 sprays per nostril once a day if needed for stuffy nose Flovent 110-2 puffs once a day to prevent coughing or wheezing but if the asthma is not well controlled, increase it to 2 puffs twice a day Montelukast 10 mg-take 1 tablet once a day to prevent coughing or wheezing Pro-air 2 puffs every 4 hours if needed for wheezing or coughing spells Call us if you are not doing well on this treatment plan.  Avoid shellfish in particular scallops , tree nuts, watermelon and bananas.  If  you have an allergic reaction take Benadryl 50 mg every 4 hours and if you have life-threatening symptoms inject with EpiPen 0.3 mg I will call you with the results of your  blood work looking for an allergy to shrimp and crab , in order to consider a gradual oral challenge to shrimp or crab   Return in about 6 months (around 02/26/2019).    Thank you for the opportunity to care for this patient.  Please do not hesitate to contact me with questions.  Tonette BihariJ. A. Bardelas, M.Gates.  [facility] [address] [place], [place] [address] [phone]

## 2018-08-27 NOTE — Patient Instructions (Addendum)
Cetirizine 10 mg-take 1 tablet once a day if needed for runny nose or itchy eyes Fluticasone 2 sprays per nostril once a day if needed for stuffy nose Flovent 110-2 puffs once a day to prevent coughing or wheezing but if the asthma is not well controlled, increase it to 2 puffs twice a day Montelukast 10 mg-take 1 tablet once a day to prevent coughing or wheezing Pro-air 2 puffs every 4 hours if needed for wheezing or coughing spells Call us if you are not doing well on this treatment plan.  Avoid shellfish in particular scallops , tree nuts, watermelon and bananas.  If you have an allergic reaction take Benadryl 50 mg every 4 hours and if you have life-threatening symptoms inject with EpiPen 0.3 mg I will call you with the results of your  blood work looking for an allergy to shrimp and crab , in order to consider a gradual oral challenge to shrimp or crab

## 2018-08-29 ENCOUNTER — Other Ambulatory Visit: Payer: Self-pay

## 2018-08-29 ENCOUNTER — Encounter (HOSPITAL_BASED_OUTPATIENT_CLINIC_OR_DEPARTMENT_OTHER): Payer: Self-pay

## 2018-08-29 ENCOUNTER — Emergency Department (HOSPITAL_BASED_OUTPATIENT_CLINIC_OR_DEPARTMENT_OTHER)
Admission: EM | Admit: 2018-08-29 | Discharge: 2018-08-29 | Disposition: A | Discharge status: Home / Self Care | Payer: 59 | Attending: Emergency Medicine | Admitting: Emergency Medicine

## 2018-08-29 ENCOUNTER — Emergency Department (HOSPITAL_BASED_OUTPATIENT_CLINIC_OR_DEPARTMENT_OTHER): Payer: 59

## 2018-08-29 DIAGNOSIS — Z79899 Other long term (current) drug therapy: Secondary | ICD-10-CM | POA: Insufficient documentation

## 2018-08-29 DIAGNOSIS — R51 Headache: Secondary | ICD-10-CM | POA: Diagnosis not present

## 2018-08-29 DIAGNOSIS — I10 Essential (primary) hypertension: Secondary | ICD-10-CM | POA: Diagnosis not present

## 2018-08-29 DIAGNOSIS — R030 Elevated blood-pressure reading, without diagnosis of hypertension: Secondary | ICD-10-CM | POA: Diagnosis not present

## 2018-08-29 DIAGNOSIS — R519 Headache, unspecified: Secondary | ICD-10-CM

## 2018-08-29 DIAGNOSIS — J45909 Unspecified asthma, uncomplicated: Secondary | ICD-10-CM | POA: Diagnosis not present

## 2018-08-29 DIAGNOSIS — J3489 Other specified disorders of nose and nasal sinuses: Secondary | ICD-10-CM | POA: Diagnosis not present

## 2018-08-29 MED ORDER — ACETAMINOPHEN 325 MG PO TABS
650.0000 mg | ORAL_TABLET | Freq: Once | ORAL | Status: AC
  Administered 2018-08-29: 650 mg via ORAL
  Filled 2018-08-29: qty 2

## 2018-08-29 MED ORDER — SODIUM CHLORIDE 0.9 % IV BOLUS
1000.0000 mL | Freq: Once | INTRAVENOUS | Status: AC
  Administered 2018-08-29: 1000 mL via INTRAVENOUS

## 2018-08-29 MED ORDER — DEXAMETHASONE SODIUM PHOSPHATE 10 MG/ML IJ SOLN
10.0000 mg | Freq: Once | INTRAMUSCULAR | Status: AC
  Administered 2018-08-29: 10 mg via INTRAVENOUS
  Filled 2018-08-29: qty 1

## 2018-08-29 NOTE — ED Notes (Signed)
Patient transported to CT 

## 2018-08-29 NOTE — Discharge Instructions (Signed)
Take Tylenol as directed for headache. Follow-up with your primary care provider if headaches persist.  Return to ER for new or worsening symptoms.

## 2018-08-29 NOTE — ED Triage Notes (Signed)
Pt pinched his nose during a sneeze yesterday-c/o pain to right side of face-seen at [facility] today and advised to come to ED "possible ruptured sinus wall"-pt NAD-steady gait-father with pt

## 2018-08-29 NOTE — ED Notes (Signed)
ED Provider at bedside. 

## 2018-08-29 NOTE — ED Provider Notes (Signed)
[facility] EMERGENCY DEPARTMENT Provider Note   CSN: [id number] Arrival date & time: 08/29/18  1656     History   Chief Complaint Chief Complaint  Patient presents with  . Headache    HPI Marvin Gates is a 18 y.o. male.  18 year old male presents with complaint of right-sided headache.  Patient states he felt fine yesterday and then stifled a sneeze which resulted in sudden onset of diffuse right side head and face pain.  Patient states that his teeth hurt and he is light sensitive.  Denies body aches or other flu symptoms.  Patient was given meloxicam at urgent care today and advised to follow-up with ENT and to go to the ER if his headache got worse.  Patient states that it became worse so he came to the ER.  Denies nausea, vomiting, changes in vision, speech, gait.  No prior history of headaches.  No other complaints or concerns.     Past Medical History:  Diagnosis Date  . Allergic rhinitis   . Asthma   . Food allergy     Patient Active Problem List   Diagnosis Date Noted  . Mild persistent asthma without complication 08/27/2018  . Pollen-food allergy 10/02/2017  . Anaphylactic reaction due to food, subsequent encounter 09/04/2017  . BMI 95th percentile or greater with athletic build, pediatric 05/23/2016  . Mild intermittent asthma without complication 05/09/2016  . Insect sting allergy, current reaction 05/09/2016  . History of insect sting allergy 03/29/2016  . Seasonal allergic rhinitis due to pollen 03/29/2016  . Acute non-recurrent pansinusitis 03/29/2016  . Essential hypertension 11/20/2015  . Moderate persistent asthma with acute exacerbation 06/15/2015  . Other allergic rhinitis 06/15/2015    Past Surgical History:  Procedure Laterality Date  . TONSILLECTOMY          Home Medications    Prior to Admission medications   Medication Sig Start Date End Date Taking? Authorizing Provider  albuterol (PROAIR HFA) 108 (90 Base) MCG/ACT inhaler  Inhale 2 puffs into the lungs every 4 (four) hours as needed for wheezing or shortness of breath. 02/21/18   Fletcher Anon, MD  amLODipine (NORVASC) 5 MG tablet Take 1 tablet (5 mg total) by mouth daily. 07/02/18   Sharlene Dory, DO  cetirizine (ZYRTEC) 10 MG tablet Take 10 mg by mouth daily.    [provider]  EPINEPHrine (EPIPEN 2-PAK) 0.3 mg/0.3 mL IJ SOAJ injection Use as directed for severe allergic reaction 09/05/17   Fletcher Anon, MD  fluticasone (FLOVENT HFA) 110 MCG/ACT inhaler Take 1 puff by mouth 2 (two) times daily. 01/08/18   [provider]  montelukast (SINGULAIR) 10 MG tablet TAKE 1 TABLET(10 MG) BY MOUTH AT BEDTIME 08/15/18   Ambs, Norvel Richards, FNP    Family History Family History  Problem Relation Age of Onset  . Asthma Mother   . Allergic rhinitis Mother   . Food Allergy Mother        shellfish allergy  . Diabetes Mother   . High blood pressure Mother   . Diabetes Father   . High blood pressure Father   . Angioedema Neg Hx   . Eczema Neg Hx   . Immunodeficiency Neg Hx   . Urticaria Neg Hx     Social History Social History   Tobacco Use  . Smoking status: Never Smoker  . Smokeless tobacco: Never Used  Substance Use Topics  . Alcohol use: No    Alcohol/week: 0.0 standard  drinks  . Drug use: No     Allergies   Banana; Shellfish allergy; and Wasp venom   Review of Systems Review of Systems  Constitutional: Negative for fever.  HENT: Positive for sneezing. Negative for congestion, ear pain and facial swelling.   Eyes: Positive for photophobia.  Respiratory: Negative for cough.   Gastrointestinal: Negative for nausea and vomiting.  Musculoskeletal: Negative for neck pain and neck stiffness.  Skin: Negative for rash and wound.  Allergic/Immunologic: Negative for immunocompromised state.  Neurological: Positive for headaches.  Hematological: Does not bruise/bleed easily.  Psychiatric/Behavioral: Negative for confusion.  All  other systems reviewed and are negative.    Physical Exam Updated Vital Signs BP (!) 158/88 (BP Location: Left Arm)   Pulse 84   Temp (!) 97.5 F (36.4 C) (Oral)   Resp 18   Ht 6\' 2"  (1.88 m)   Wt 131.5 kg   SpO2 100%   BMI 37.23 kg/m   Physical Exam Vitals signs and nursing note reviewed.  Constitutional:      General: He is not in acute distress.    Appearance: He is well-developed. He is not diaphoretic.  HENT:     Head: Normocephalic and atraumatic.     Mouth/Throat:     Mouth: Mucous membranes are moist.     Pharynx: Oropharynx is clear.  Eyes:     Extraocular Movements: Extraocular movements intact.     Pupils: Pupils are equal, round, and reactive to light.  Neck:     Musculoskeletal: Neck supple.  Cardiovascular:     Rate and Rhythm: Normal rate and regular rhythm.     Heart sounds: Normal heart sounds. No murmur.  Pulmonary:     Effort: Pulmonary effort is normal.     Breath sounds: Normal breath sounds.  Lymphadenopathy:     Cervical: No cervical adenopathy.  Skin:    General: Skin is warm and dry.  Neurological:     Mental Status: He is alert and oriented to person, place, and time.     GCS: GCS eye subscore is 4. GCS verbal subscore is 5. GCS motor subscore is 6.     Cranial Nerves: No cranial nerve deficit or facial asymmetry.     Sensory: No sensory deficit.     Motor: No weakness.     Gait: Gait normal.  Psychiatric:        Mood and Affect: Mood normal.        Behavior: Behavior normal.      ED Treatments / Results  Labs (all labs ordered are listed, but only abnormal results are displayed) Labs Reviewed - No data to display  EKG None  Radiology Ct Head Wo Contrast  Result Date: 08/29/2018 CLINICAL DATA:  Right-sided headache radiating to the right face for 2 days. Worse attic of life. No reported injury. EXAM: CT HEAD WITHOUT CONTRAST TECHNIQUE: Contiguous axial images were obtained from the base of the skull through the vertex without  intravenous contrast. COMPARISON:  None. FINDINGS: Brain: No evidence of parenchymal hemorrhage or extra-axial fluid collection. No mass lesion, mass effect, or midline shift. No CT evidence of acute infarction. Cerebral volume is age appropriate. No ventriculomegaly. Vascular: No acute abnormality. Skull: No evidence of calvarial fracture. Sinuses/Orbits: Mucous retention cysts versus polyps in the inferior maxillary sinuses bilaterally. No fluid levels. Other:  The mastoid air cells are unopacified. IMPRESSION: 1. No evidence of acute intracranial abnormality. No evidence of calvarial fracture. 2. Mucous retention cysts versus polyps  in the inferior maxillary sinuses bilaterally. Electronically Signed   By: Delbert Phenix M.D.   On: 08/29/2018 18:06    Procedures Procedures (including critical care time)  Medications Ordered in ED Medications  sodium chloride 0.9 % bolus 1,000 mL (1,000 mLs Intravenous New Bag/Given 08/29/18 1750)  acetaminophen (TYLENOL) tablet 650 mg (650 mg Oral Given 08/29/18 1835)  dexamethasone (DECADRON) injection 10 mg (10 mg Intravenous Given 08/29/18 1834)     Initial Impression / Assessment and Plan / ED Course  I have reviewed the triage vital signs and the nursing notes.  Pertinent labs & imaging results that were available during my care of the patient were reviewed by me and considered in my medical decision making (see chart for details).  Clinical Course as of Aug 29 1846  Wed Aug 29, 2018  1845 17yo male brought in by dad for diffuse right side head/face pain onset last night after stiffening a sneeze.  Neuro exam is unremarkable.  Patient was given meloxicam at urgent care earlier today.  CT had completed due to no prior history of headaches.  CT head shows bilateral maxillary sinus polyps otherwise unremarkable.  Patient's blood pressure and pain have improved with IV fluids.  He was also given a dose of Tylenol and Decadron prior to discharge.  Recommend follow-up  with PCP, return to ER for new or worsening symptoms.   [LM]    Clinical Course User Index [LM] Jeannie Fend, PA-C   Final Clinical Impressions(s) / ED Diagnoses   Final diagnoses:  Acute nonintractable headache, unspecified headache type  Blood pressure elevated without history of HTN    ED Discharge Orders    None       Alden Hipp 08/29/18 Kermit Balo, MD 08/30/18 2139

## 2018-08-31 ENCOUNTER — Ambulatory Visit: Payer: 59 | Admitting: Allergy

## 2018-09-03 ENCOUNTER — Ambulatory Visit: Payer: 59 | Admitting: Pediatrics

## 2018-09-03 ENCOUNTER — Ambulatory Visit: Payer: 59 | Admitting: Family Medicine

## 2018-09-06 ENCOUNTER — Ambulatory Visit: Payer: 59 | Admitting: Family Medicine

## 2018-09-06 ENCOUNTER — Encounter: Payer: Self-pay | Admitting: Family Medicine

## 2018-09-06 VITALS — BP 120/82 | HR 92 | Temp 98.5°F | Ht 75.0 in | Wt 293.1 lb

## 2018-09-06 DIAGNOSIS — J341 Cyst and mucocele of nose and nasal sinus: Secondary | ICD-10-CM

## 2018-09-06 DIAGNOSIS — Z23 Encounter for immunization: Secondary | ICD-10-CM | POA: Diagnosis not present

## 2018-09-06 NOTE — Addendum Note (Signed)
Addended by: Scharlene Gloss B on: 09/06/2018 01:16 PM   Modules accepted: Orders

## 2018-09-06 NOTE — Progress Notes (Signed)
Chief Complaint  Patient presents with  . Facial Pain    right side    Subjective: Patient is a 18 y.o. male here for R sided facial pain. Here w dad.   Stifled a sneeze, had immediate pain. Went to UC, told that if it gets worse go to ED. It got worse. CT max showed mucous retention cysts. Was told to f/u with ENT, here to get referral. Pain has resolved. He does seem to have freq sinus infections in past. No s/s's of URI today.  ROS: Const: No fevers  Past Medical History:  Diagnosis Date  . Allergic rhinitis   . Asthma   . Food allergy     Objective: BP 120/82 (BP Location: Left Arm, Patient Position: Sitting, Cuff Size: Large)   Pulse 92   Temp 98.5 F (36.9 C) (Oral)   Ht 6\' 3"  (1.905 m)   Wt 293 lb 2 oz (133 kg)   SpO2 98%   BMI 36.64 kg/m  General: Awake, appears stated age HEENT: MMM, EOMi, R canal obstructed 100% cerumen, L canal patent, TM neg, sinuses are not ttp, nares patent, no d/c Heart: RRR Lungs: CTAB, no rales, wheezes or rhonchi. No accessory muscle use Psych: Age appropriate judgment and insight, normal affect and mood  Assessment and Plan: Mucous retention cyst of maxillary sinus - Plan: Ambulatory referral to ENT  Orders as above. INCS until he sees ENT.  F/u as originally scheduled.  The patient and dad voiced understanding and agreement to the plan.  Jilda Roche [place], DO 09/06/18  1:12 PM

## 2018-09-06 NOTE — Patient Instructions (Addendum)
OK to use Debrox (peroxide) in the ear to loosen up wax. Also recommend using a bulb syringe (for removing boogers from baby's noses) to flush through warm water. Do not use Q-tips as this can impact wax further.  If you do not hear anything about your referral in the next 1-2 weeks, call our office and ask for an update.  Use Flonase daily until you see the specialist.   Let us know if you need anything.

## 2018-09-14 ENCOUNTER — Ambulatory Visit: Payer: 59 | Admitting: Family Medicine

## 2018-09-14 ENCOUNTER — Encounter: Payer: Self-pay | Admitting: Family Medicine

## 2018-09-14 VITALS — BP 110/80 | HR 86 | Temp 98.7°F | Ht 74.0 in | Wt 299.1 lb

## 2018-09-14 DIAGNOSIS — R41840 Attention and concentration deficit: Secondary | ICD-10-CM

## 2018-09-14 MED ORDER — ATOMOXETINE HCL 40 MG PO CAPS: 40.0000 mg | ORAL_CAPSULE | Freq: Every day | ORAL | 2 refills | Status: DC

## 2018-09-14 NOTE — Progress Notes (Signed)
Chief Complaint  Patient presents with  . Follow-up    Subjective: Patient is a 18 y.o. male here for possible adhd.  Was supposed to be evaluated for ADHD in 4th grade, but never had it done. This has been going on since early elementary school. Has difficulty focusing in class and at home. Uncle has ADHD.    ROS: Heart: Denies chest pain  Lungs: Denies SOB   Past Medical History:  Diagnosis Date  . Allergic rhinitis   . Asthma   . Food allergy     Objective: BP 110/80 (BP Location: Left Arm, Patient Position: Sitting, Cuff Size: Large)   Pulse 86   Temp 98.7 F (37.1 C) (Oral)   Ht 6\' 2"  (1.88 m)   Wt 299 lb 2 oz (135.7 kg)   SpO2 98%   BMI 38.41 kg/m  General: Awake, appears stated age Heart: RRR, no murmurs Lungs: CTAB, no rales, wheezes or rhonchi. No accessory muscle use Psych: Age appropriate judgment and insight, normal affect and mood  Assessment and Plan: Inattention - Plan: atomoxetine (STRATTERA) 40 MG capsule, Ambulatory referral to Psychology  Formal eval. Start Strattera rather than stimulant given hx of HTN. F/u in 6 weeks.  The patient voiced understanding and agreement to the plan.  Jilda Rocheicholas [place], DO 09/14/18  1:32 PM

## 2018-09-14 NOTE — Patient Instructions (Addendum)
If you do not hear anything about your referral in the next 1-2 weeks, call our office and ask for an update.  Stay active, get >7 hrs of sleep nightly, keep the diet clean.  Let us know if you need anything.

## 2018-09-14 NOTE — Progress Notes (Signed)
Pre visit review using our clinic review tool, if applicable. No additional management support is needed unless otherwise documented below in the visit note. 

## 2018-09-17 ENCOUNTER — Telehealth: Payer: Self-pay | Admitting: Family Medicine

## 2018-09-17 DIAGNOSIS — R41840 Attention and concentration deficit: Secondary | ICD-10-CM

## 2018-09-17 MED ORDER — ATOMOXETINE HCL 40 MG PO CAPS: 40.0000 mg | ORAL_CAPSULE | Freq: Every day | ORAL | 2 refills | Status: DC

## 2018-09-17 NOTE — Telephone Encounter (Signed)
Copied from CRM [id number]. Topic: Quick Communication - Rx Refill/Question >> Sep 17, 2018 11:35 AM Zada Girt, Lumin L wrote: Medication: atomoxetine (STRATTERA) 40 MG capsule (to expensive at [facility])  Has the patient contacted their pharmacy? Yes.   (Agent: If no, request that the patient contact the pharmacy for the refill.) (Agent: If yes, when and what did the pharmacy advise?)  Preferred Pharmacy (with phone number or street name): [facility] [address] [place], [place] - [address] [place] [address] Phone: [phone] Fax: [phone]  Agent: Please be advised that RX refills may take up to 3 business days. We ask that you follow-up with your pharmacy.

## 2018-09-27 ENCOUNTER — Other Ambulatory Visit: Payer: Self-pay | Admitting: Family Medicine

## 2018-10-16 ENCOUNTER — Other Ambulatory Visit: Payer: Self-pay | Admitting: Family Medicine

## 2018-10-16 ENCOUNTER — Encounter: Payer: Self-pay | Admitting: Family Medicine

## 2018-11-06 DIAGNOSIS — J3489 Other specified disorders of nose and nasal sinuses: Secondary | ICD-10-CM | POA: Diagnosis not present

## 2018-11-06 DIAGNOSIS — J341 Cyst and mucocele of nose and nasal sinus: Secondary | ICD-10-CM | POA: Diagnosis not present

## 2018-11-06 DIAGNOSIS — G43909 Migraine, unspecified, not intractable, without status migrainosus: Secondary | ICD-10-CM | POA: Diagnosis not present

## 2018-12-27 ENCOUNTER — Other Ambulatory Visit: Payer: Self-pay | Admitting: *Deleted

## 2018-12-27 MED ORDER — ALBUTEROL SULFATE HFA 108 (90 BASE) MCG/ACT IN AERS: 2.0000 | INHALATION_SPRAY | RESPIRATORY_TRACT | 2 refills | Status: DC | PRN

## 2019-01-03 ENCOUNTER — Other Ambulatory Visit: Payer: Self-pay | Admitting: *Deleted

## 2019-01-03 MED ORDER — ALBUTEROL SULFATE HFA 108 (90 BASE) MCG/ACT IN AERS: 2.0000 | INHALATION_SPRAY | RESPIRATORY_TRACT | 2 refills | Status: DC | PRN

## 2019-02-01 ENCOUNTER — Encounter: Payer: Self-pay | Admitting: Family Medicine

## 2019-02-04 ENCOUNTER — Other Ambulatory Visit: Payer: Self-pay | Admitting: Family Medicine

## 2019-02-04 DIAGNOSIS — R41840 Attention and concentration deficit: Secondary | ICD-10-CM

## 2019-02-11 ENCOUNTER — Encounter: Payer: 59 | Admitting: Family Medicine

## 2019-02-13 IMAGING — CT CT HEAD W/O CM
3 series · 15 of 47 positions shown, 18 images · non-contrast
Comparison: None.

CLINICAL DATA: Right-sided headache radiating to the right face for
2 days. Worse attic of life. No reported injury.

EXAM:
CT HEAD WITHOUT CONTRAST
TECHNIQUE: Contiguous axial images were obtained from the base of the skull
through the vertex without intravenous contrast.

[Series 2: head wo · axial · 0.44mm/px · z∈[+870,+1000]mm · 9 of 32 slices shown, 12 images]
[im 3/32  brain]
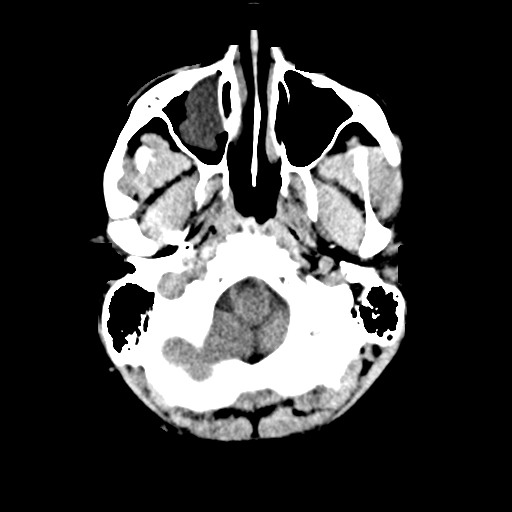
[im 3/32  bone]
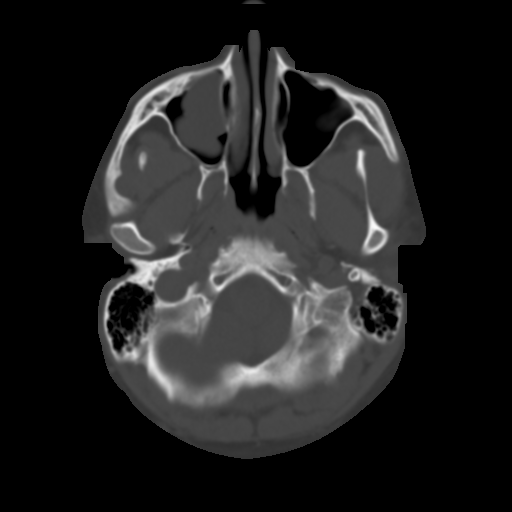
[im 6/32  brain]
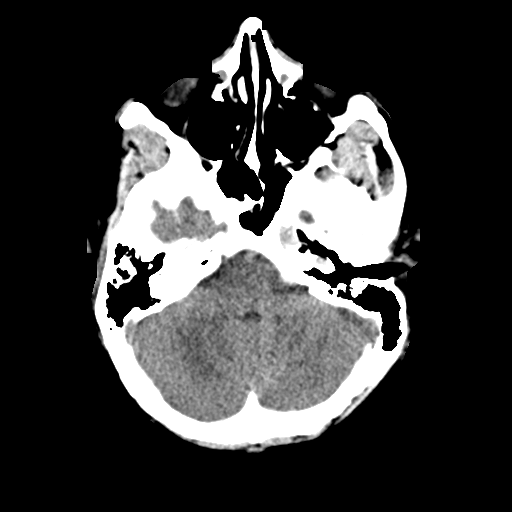
[im 9/32  brain]
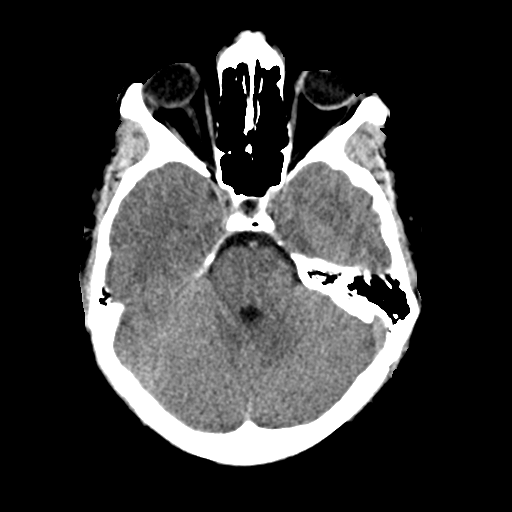
[im 12/32  brain]
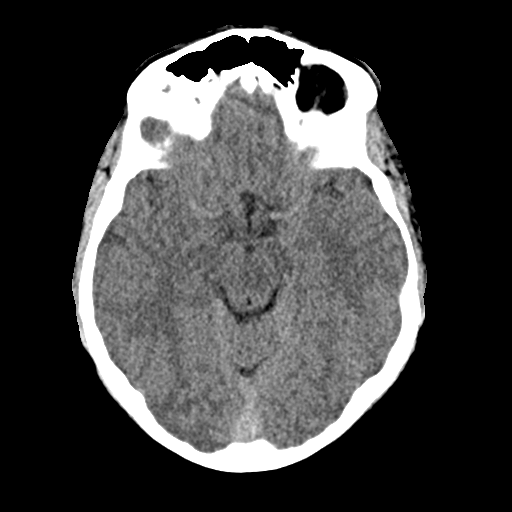
[im 17/32  brain]
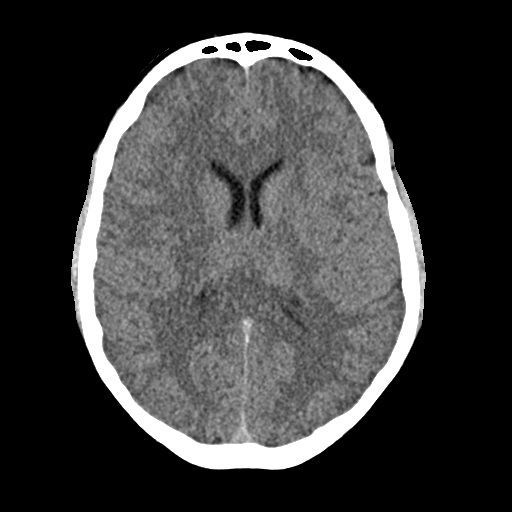
[im 17/32  bone]
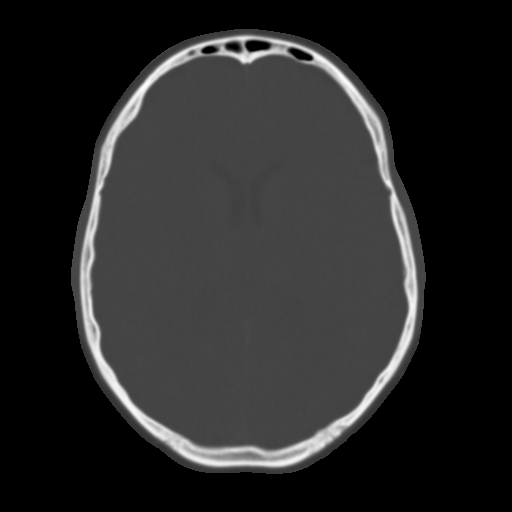
[im 20/32  brain]
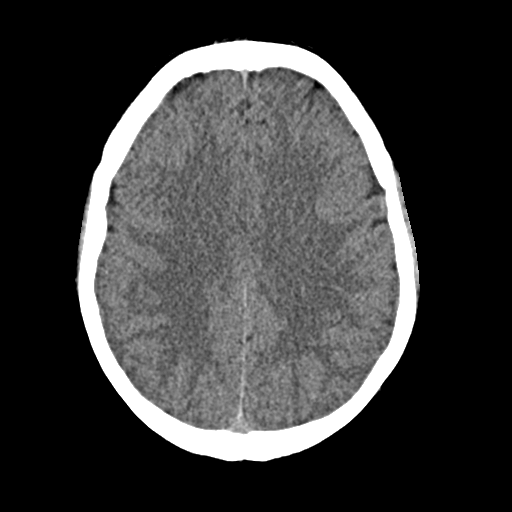
[im 23/32  brain]
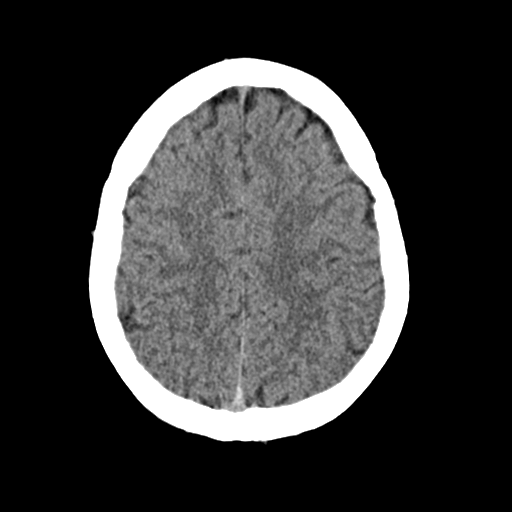
[im 26/32  brain]
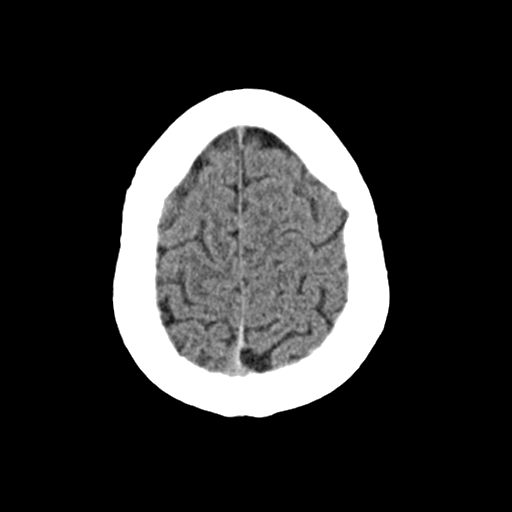
[im 29/32  brain]
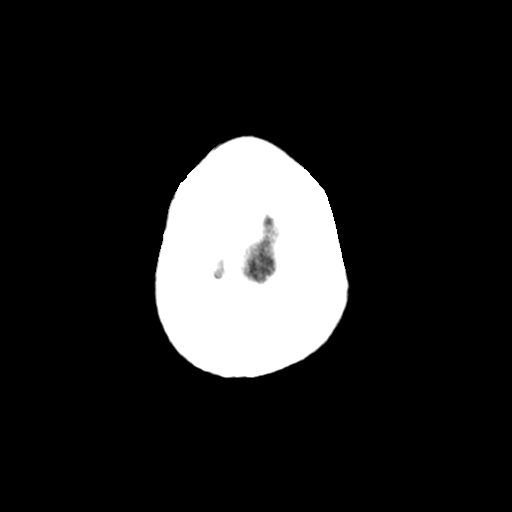
[im 29/32  bone]
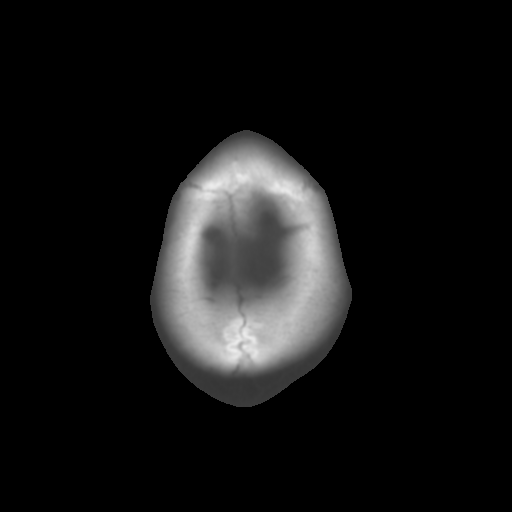

[Series 4: coronal soft · coronal · 0.33mm/px · 3 of 72 slices shown]
[im 24/72  brain]
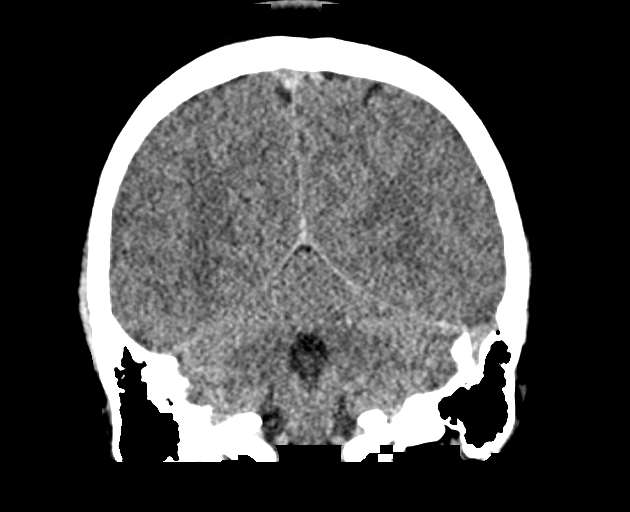
[im 32/72  brain]
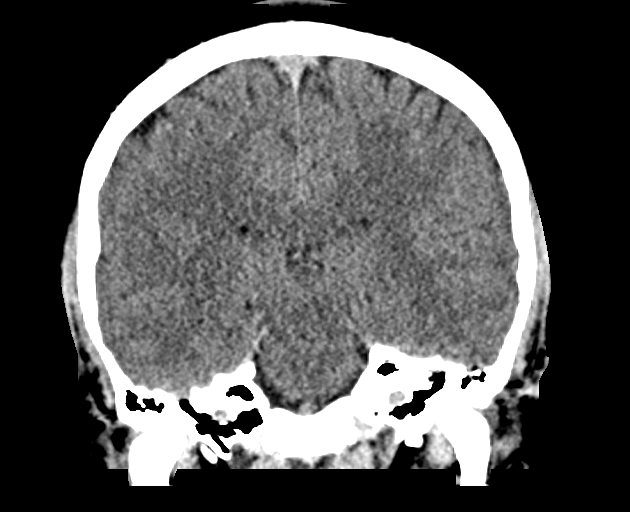
[im 40/72  brain]
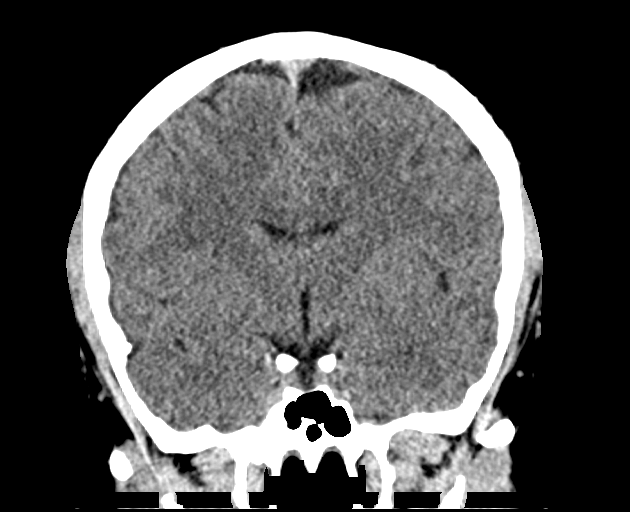

[Series 5: sag soft · sagittal · 0.32mm/px · 3 of 67 slices shown]
[im 23/67  brain]
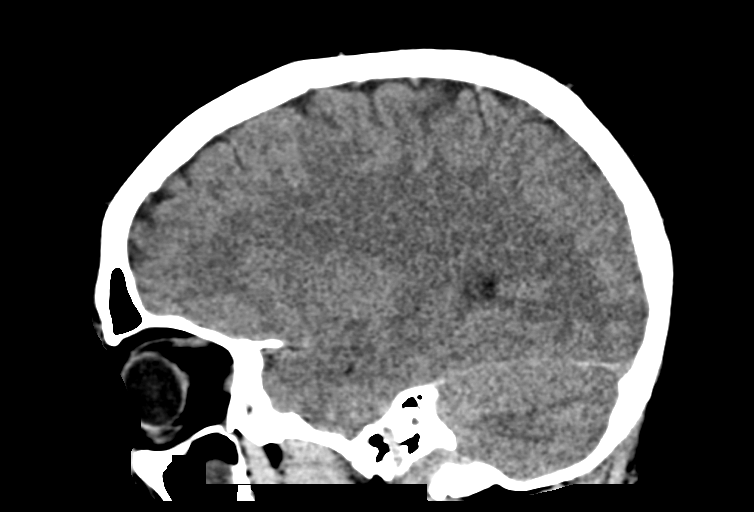
[im 34/67  brain]
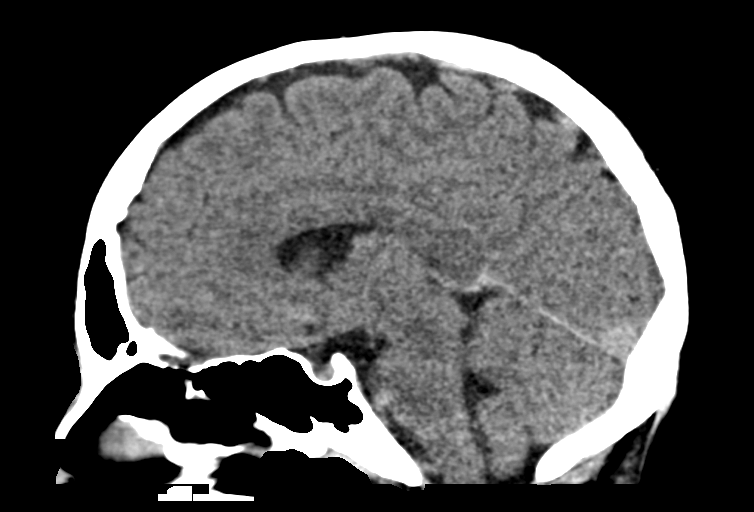
[im 45/67  brain]
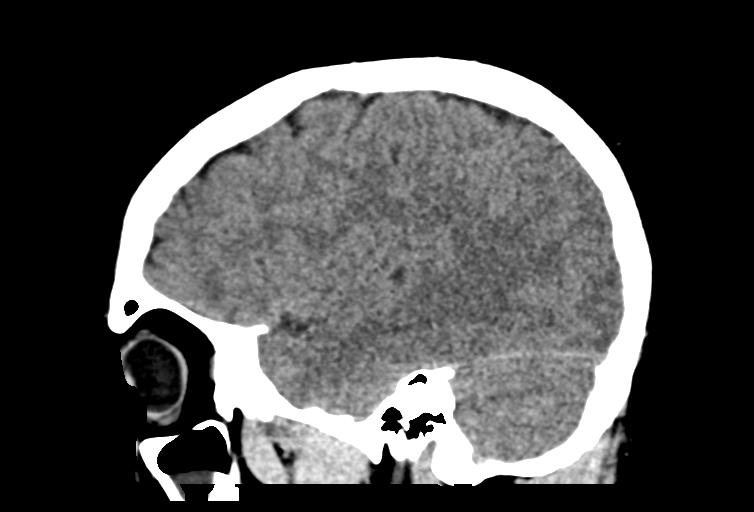

[15 of 47 positions shown; findings below may reference images not displayed]

FINDINGS: Brain: No evidence of parenchymal hemorrhage or extra-axial fluid
collection. No mass lesion, mass effect, or midline shift. No CT
evidence of acute infarction. Cerebral volume is age appropriate. No
ventriculomegaly.

Vascular: No acute abnormality.

Skull: No evidence of calvarial fracture.

Sinuses/Orbits: Mucous retention cysts versus polyps in the inferior
maxillary sinuses bilaterally. No fluid levels.

Other:  The mastoid air cells are unopacified.
IMPRESSION: 1. No evidence of acute intracranial abnormality. No evidence of
calvarial fracture.
2. Mucous retention cysts versus polyps in the inferior maxillary
sinuses bilaterally.

## 2019-02-22 ENCOUNTER — Ambulatory Visit (INDEPENDENT_AMBULATORY_CARE_PROVIDER_SITE_OTHER): Payer: 59 | Admitting: Family Medicine

## 2019-02-22 ENCOUNTER — Encounter: Payer: Self-pay | Admitting: Family Medicine

## 2019-02-22 ENCOUNTER — Other Ambulatory Visit: Payer: Self-pay

## 2019-02-22 VITALS — BP 120/84 | HR 97 | Temp 98.3°F | Ht 75.0 in | Wt 297.0 lb

## 2019-02-22 DIAGNOSIS — Z114 Encounter for screening for human immunodeficiency virus [HIV]: Secondary | ICD-10-CM

## 2019-02-22 DIAGNOSIS — Z0000 Encounter for general adult medical examination without abnormal findings: Secondary | ICD-10-CM

## 2019-02-22 DIAGNOSIS — R41840 Attention and concentration deficit: Secondary | ICD-10-CM | POA: Diagnosis not present

## 2019-02-22 LAB — COMPREHENSIVE METABOLIC PANEL
ALT: 18 U/L (ref 0–53)
AST: 13 U/L (ref 0–37)
Albumin: 4.9 g/dL (ref 3.5–5.2)
Alkaline Phosphatase: 55 U/L (ref 52–171)
BUN: 10 mg/dL (ref 6–23)
CO2: 28 mEq/L (ref 19–32)
Calcium: 9.7 mg/dL (ref 8.4–10.5)
Chloride: 105 mEq/L (ref 96–112)
Creatinine, Ser: 1.06 mg/dL (ref 0.40–1.50)
GFR: 89.4 mL/min (ref >60.00)
Glucose, Bld: 84 mg/dL (ref 70–99)
Potassium: 4.2 mEq/L (ref 3.5–5.1)
Sodium: 141 mEq/L (ref 135–145)
Total Bilirubin: 0.6 mg/dL (ref 0.2–1.2)
Total Protein: 7 g/dL (ref 6.0–8.3)

## 2019-02-22 LAB — CBC
HCT: 43 % (ref 36.0–49.0)
Hemoglobin: 14.8 g/dL (ref 12.0–16.0)
MCHC: 34.4 g/dL (ref 31.0–37.0)
MCV: 93 fl (ref 78.0–98.0)
Platelets: 240 10*3/uL (ref 150.0–575.0)
RBC: 4.63 Mil/uL (ref 3.80–5.70)
RDW: 12.5 % (ref 11.4–15.5)
WBC: 7.4 10*3/uL (ref 4.5–13.5)

## 2019-02-22 LAB — LIPID PANEL
Cholesterol: 175 mg/dL (ref 0–200)
HDL: 29.8 mg/dL — ABNORMAL LOW (ref >39.00)
LDL Cholesterol: 112 mg/dL — ABNORMAL HIGH (ref 0–99)
NonHDL: 145.19
Total CHOL/HDL Ratio: 6
Triglycerides: 164 mg/dL — ABNORMAL HIGH (ref 0.0–149.0)
VLDL: 32.8 mg/dL (ref 0.0–40.0)

## 2019-02-22 MED ORDER — AMPHETAMINE-DEXTROAMPHET ER 20 MG PO CP24: 20.0000 mg | ORAL_CAPSULE | Freq: Every day | ORAL | 0 refills | Status: DC

## 2019-02-22 NOTE — Patient Instructions (Signed)
Keep the diet clean and stay active.  Give us 2-3 business days to get the results of your labs back.   Do monthly self testicular checks in the shower. You are feeling for lumps/bumps that don't belong. If you feel anything like this, let me know!  Let us know if you need anything. 

## 2019-02-22 NOTE — Progress Notes (Addendum)
Chief Complaint  Patient presents with  . Annual Exam    Well Male Marvin Gates is here for a complete physical.   His last physical was >1 year ago.  Current diet: in general, diet could be better.   Current exercise: walking Weight trend: increasing since pandemic Does pt snore? No.  Daytime fatigue? No. Seat belt? Yes.    Health maintenance Tetanus- Yes HIV- No  Past Medical History:  Diagnosis Date  . Allergic rhinitis   . Asthma   . Food allergy      Past Surgical History:  Procedure Laterality Date  . TONSILLECTOMY      Medications  Current Outpatient Medications on File Prior to Visit  Medication Sig Dispense Refill  . albuterol (PROAIR HFA) 108 (90 Base) MCG/ACT inhaler Inhale 2 puffs into the lungs every 4 (four) hours as needed for wheezing or shortness of breath. 1 Inhaler 2  . amLODipine (NORVASC) 5 MG tablet TAKE ONE TABLET BY MOUTH DAILY 90 tablet 1  . atomoxetine (STRATTERA) 40 MG capsule Take 1 capsule (40 mg total) by mouth daily. 30 capsule 2  . cetirizine (ZYRTEC) 10 MG tablet Take 10 mg by mouth daily.    Marland Kitchen EPINEPHrine (EPIPEN 2-PAK) 0.3 mg/0.3 mL IJ SOAJ injection Use as directed for severe allergic reaction 4 Device 2  . fluticasone (FLOVENT HFA) 110 MCG/ACT inhaler Take 1 puff by mouth 2 (two) times daily.    . montelukast (SINGULAIR) 10 MG tablet TAKE 1 TABLET BY MOUTH EVERY NIGHT AT BEDTIME 30 tablet 5   Allergies Allergies  Allergen Reactions  . Banana Itching    Mouth itches  . Shellfish Allergy     Positive allergy test  . Wasp Venom Swelling    Family History Family History  Problem Relation Age of Onset  . Asthma Mother   . Allergic rhinitis Mother   . Food Allergy Mother        shellfish allergy  . Diabetes Mother   . High blood pressure Mother   . Diabetes Father   . High blood pressure Father   . Angioedema Neg Hx   . Eczema Neg Hx   . Immunodeficiency Neg Hx   . Urticaria Neg Hx     Review of  Systems: Constitutional: no fevers or chills Eye:  no recent significant change in vision Ear/Nose/Mouth/Throat:  Ears:  no tinnitus or hearing loss Nose/Mouth/Throat:  no complaints of nasal congestion, no sore throat Cardiovascular:  no chest pain, no palpitations Respiratory:  no cough and no shortness of breath Gastrointestinal:  no abdominal pain, no change in bowel habits GU:  Male: negative for dysuria, frequency, and incontinence and negative for prostate symptoms Musculoskeletal/Extremities:  no pain, redness, or swelling of the joints Integumentary (Skin/Breast):  no abnormal skin lesions reported Neurologic:  no headaches, no numbness, tingling Endocrine: No unexpected weight changes Hematologic/Lymphatic:  no night sweats  Exam BP 120/84 (BP Location: Left Arm, Patient Position: Sitting, Cuff Size: Normal)   Pulse 97   Temp 98.3 F (36.8 C) (Oral)   Ht 6\' 3"  (1.905 m)   Wt 297 lb (134.7 kg)   SpO2 98%   BMI 37.12 kg/m  General:  well developed, well nourished, in no apparent distress Skin:  no significant moles, warts, or growths Head:  no masses, lesions, or tenderness Eyes:  pupils equal and round, sclera anicteric without injection Ears:  canals without lesions, TMs shiny without retraction, no obvious effusion, no erythema Nose:  nares patent, septum midline, mucosa normal Throat/Pharynx:  lips and gingiva without lesion; tongue and uvula midline; non-inflamed pharynx; no exudates or postnasal drainage Neck: neck supple without adenopathy, thyromegaly, or masses Lungs:  clear to auscultation, breath sounds equal bilaterally, no respiratory distress Cardio:  regular rate and rhythm, no bruits, no LE edema Abdomen:  abdomen soft, nontender; bowel sounds normal; no masses or organomegaly Genital (male): Uncircumcised penis, no lesions or discharge; testes present bilaterally without masses or tenderness Rectal: Deferred Musculoskeletal:  symmetrical muscle groups  noted without atrophy or deformity Extremities:  no clubbing, cyanosis, or edema, no deformities, no skin discoloration Neuro:  gait normal; deep tendon reflexes normal and symmetric Psych: well oriented with normal range of affect and appropriate judgment/insight  Assessment and Plan  Well adult exam - Plan: CBC, Comprehensive metabolic panel, Lipid panel  Screening for HIV (human immunodeficiency virus) - Plan: HIV Antibody (routine testing w rflx)  Inattention - Plan: Ambulatory referral to Psychology, amphetamine-dextroamphetamine (ADDERALL XR) 20 MG 24 hr capsule, I will see him in early fall to see how he is doing back in school on medicine.    Well 18 y.o. male. Counseled on diet and exercise. Other orders as above. The patient voiced understanding and agreement to the plan.  Jilda Rocheicholas Paul [place], DO 02/22/19 1:25 PM

## 2019-02-23 LAB — HIV ANTIBODY (ROUTINE TESTING W REFLEX): HIV 1&2 Ab, 4th Generation: NONREACTIVE

## 2019-02-26 ENCOUNTER — Ambulatory Visit: Payer: 59 | Admitting: Pediatrics

## 2019-02-26 ENCOUNTER — Telehealth: Payer: Self-pay | Admitting: Family Medicine

## 2019-02-26 NOTE — Telephone Encounter (Signed)
Pt's Father, Marvin Gates, dropped off [employer] paperwork for completion by Dr. Nani Ravens for both the pt and the pt's father.  Please call Marvin Gates once both sets of pw are completed and he will come pick them up [phone].  Paperwork placed in Dr. Irene Limbo box for p/u & completion.

## 2019-03-05 ENCOUNTER — Other Ambulatory Visit: Payer: Self-pay

## 2019-03-05 ENCOUNTER — Encounter: Payer: Self-pay | Admitting: Pediatrics

## 2019-03-05 ENCOUNTER — Ambulatory Visit: Payer: 59 | Admitting: Pediatrics

## 2019-03-05 VITALS — BP 126/76 | HR 84 | Temp 98.2°F | Resp 12 | Ht 74.41 in | Wt 301.0 lb

## 2019-03-05 DIAGNOSIS — J4530 Mild persistent asthma, uncomplicated: Secondary | ICD-10-CM | POA: Diagnosis not present

## 2019-03-05 DIAGNOSIS — T7800XD Anaphylactic reaction due to unspecified food, subsequent encounter: Secondary | ICD-10-CM | POA: Diagnosis not present

## 2019-03-05 DIAGNOSIS — J301 Allergic rhinitis due to pollen: Secondary | ICD-10-CM | POA: Diagnosis not present

## 2019-03-05 DIAGNOSIS — T781XXD Other adverse food reactions, not elsewhere classified, subsequent encounter: Secondary | ICD-10-CM | POA: Diagnosis not present

## 2019-03-05 DIAGNOSIS — T63481D Toxic effect of venom of other arthropod, accidental (unintentional), subsequent encounter: Secondary | ICD-10-CM

## 2019-03-05 MED ORDER — CETIRIZINE HCL 10 MG PO TABS: 10.0000 mg | ORAL_TABLET | Freq: Every day | ORAL | 5 refills | Status: AC | PRN

## 2019-03-05 MED ORDER — FLOVENT HFA 110 MCG/ACT IN AERO: INHALATION_SPRAY | RESPIRATORY_TRACT | 2 refills | Status: DC

## 2019-03-05 MED ORDER — MONTELUKAST SODIUM 10 MG PO TABS: 10.0000 mg | ORAL_TABLET | Freq: Every day | ORAL | 5 refills | Status: DC

## 2019-03-05 MED ORDER — EPINEPHRINE 0.3 MG/0.3ML IJ SOAJ: INTRAMUSCULAR | 2 refills | Status: DC

## 2019-03-05 MED ORDER — ALBUTEROL SULFATE HFA 108 (90 BASE) MCG/ACT IN AERS: 2.0000 | INHALATION_SPRAY | RESPIRATORY_TRACT | 5 refills | Status: DC | PRN

## 2019-03-05 NOTE — Patient Instructions (Addendum)
Allergic rhinitis Continue cetirizine 10 mg-take 1 tablet once a day if needed for runny nose or itchy eyes Fluticasone 2 sprays per nostril once a day if needed for stuffy nose   Asthma Continue montelukast 10 mg-take 1 tablet once a day to prevent coughing or wheezing ProAir (red inhaler) 2 puffs every 4 hours if needed for cough or wheeze. Use ProAir 5-15 minutes before exercise to reduce cough or wheeze For ashtma flare, begin Flovent 110 (orange/brown inhaler). Use 2 puffs twice a day with a spacer for 2 weeks or until cough or wheeze free  Food allergy Avoid shellfish, tree nuts, watermelon and bananas.  If you have an allergic reaction take Benadryl 50 mg every 4 hours and if you have life-threatening symptoms inject with EpiPen 0.3 mg Consider a food challenge to shrimp. Remember to stop antihistamines 3 days before the challenge.   Continue the medications as listed in your chart  Call the clinic if this treatment plan is not working well for you  Follow up in 6 months or sooner if needed

## 2019-03-05 NOTE — Progress Notes (Signed)
[place] [address] Dept: [phone]  FOLLOW UP NOTE  Patient ID: Marvin Gates, male    DOB: 2001/05/04  Age: 18 y.o. MRN: [id number] Date of Office Visit: 03/05/2019  Assessment  Chief Complaint: Follow-up  HPI Marvin Gates is a 18 year old male who presents to the clinic for a follow up visit. He was last seen in this clinic on 08/27/2018 by Dr. Shaune Leeks for evaluation of asthma, allergic rhinitis, and food allergy to shellfish, tree nuts, watermelon, and bananas.  At today's visit he reports his asthma has been well controlled with no shortness of breath, cough, or wheeze with activity or rest.  He reports that he takes montelukast 10 mg once a day, however he is unsure of how he is using his inhalers.  He reports that he has not used any of his inhalers in 3 to 4 weeks.  Allergic rhinitis is reported as well controlled with occasional nasal congestion.  He takes cetirizine 10 mg once a day and does not use any steroid nasal sprays.  He continues to avoid seafood, tree nuts, watermelon, and banana.  He has not had any accidental ingestion nor has he needed to use his EpiPen since his last visit to this office.  His current medications are listed in the chart.   Drug Allergies:  Allergies  Allergen Reactions  . Banana Itching    Mouth itches  . Shellfish Allergy     Positive allergy test  . Wasp Venom Swelling    Physical Exam: BP 126/76 (BP Location: Right Arm, Patient Position: Sitting, Cuff Size: Large)   Pulse 84   Temp 98.2 F (36.8 C) (Oral)   Resp 12   Ht 6' 2.41" (1.89 m)   Wt (!) 301 lb (136.5 kg)   SpO2 96%   BMI 38.22 kg/m    Physical Exam Vitals signs reviewed.  Constitutional:      Appearance: Normal appearance.  HENT:     Head: Normocephalic and atraumatic.     Right Ear: Tympanic membrane normal.     Left Ear: Tympanic membrane normal.     Nose:     Comments: Bilateral nares slightly erythematous and edematous with clear nasal  drainage noted.  Pharynx normal.  Ears normal.  Eyes normal.    Mouth/Throat:     Pharynx: Oropharynx is clear.  Eyes:     Conjunctiva/sclera: Conjunctivae normal.  Neck:     Musculoskeletal: Normal range of motion and neck supple.  Cardiovascular:     Rate and Rhythm: Normal rate and regular rhythm.     Heart sounds: Normal heart sounds. No murmur.  Pulmonary:     Effort: Pulmonary effort is normal.     Breath sounds: Normal breath sounds.     Comments: Lungs clear to auscultation Musculoskeletal: Normal range of motion.  Skin:    General: Skin is warm and dry.  Neurological:     Mental Status: He is alert and oriented to person, place, and time.  Psychiatric:        Mood and Affect: Mood normal.        Behavior: Behavior normal.        Thought Content: Thought content normal.        Judgment: Judgment normal.     Diagnostics: FVC 6.51, FEV1 5.71.  Predicted FVC 6.11, predicted FEV1 5.07.  Spirometry is within the normal range.  Assessment and Plan: 1. Mild persistent asthma without complication   2.  Seasonal allergic rhinitis due to pollen   3. Anaphylactic reaction due to food, subsequent encounter   4. Pollen-food allergy, subsequent encounter   5. Allergic reaction to insect sting, accidental or unintentional, subsequent encounter     Meds ordered this encounter  Medications  . EPINEPHrine (EPIPEN 2-PAK) 0.3 mg/0.3 mL IJ SOAJ injection    Sig: Use as directed for severe allergic reaction    Dispense:  2 each    Refill:  2    Give patient 2-2 pak. One for school and one for home  . fluticasone (FLOVENT HFA) 110 MCG/ACT inhaler    Sig: For asthma flares, take 2 puffs twice a day with a spacer for 2 weeks or until cough and wheeze free    Dispense:  1 Inhaler    Refill:  2  . cetirizine (ZYRTEC) 10 MG tablet    Sig: Take 1 tablet (10 mg total) by mouth daily as needed for allergies.    Dispense:  31 tablet    Refill:  5  . montelukast (SINGULAIR) 10 MG tablet     Sig: Take 1 tablet (10 mg total) by mouth at bedtime.    Dispense:  30 tablet    Refill:  5  . albuterol (PROAIR HFA) 108 (90 Base) MCG/ACT inhaler    Sig: Inhale 2 puffs into the lungs every 4 (four) hours as needed for wheezing or shortness of breath.    Dispense:  18 g    Refill:  5    Patient Instructions  Allergic rhinitis Continue cetirizine 10 mg-take 1 tablet once a day if needed for runny nose or itchy eyes Fluticasone 2 sprays per nostril once a day if needed for stuffy nose   Asthma Continue montelukast 10 mg-take 1 tablet once a day to prevent coughing or wheezing ProAir (red inhaler) 2 puffs every 4 hours if needed for cough or wheeze. Use ProAir 5-15 minutes before exercise to reduce cough or wheeze For ashtma flare, begin Flovent 110 (orange/brown inhaler). Use 2 puffs twice a day with a spacer for 2 weeks or until cough or wheeze free  Food allergy Avoid shellfish, tree nuts, watermelon and bananas.  If you have an allergic reaction take Benadryl 50 mg every 4 hours and if you have life-threatening symptoms inject with EpiPen 0.3 mg Consider a food challenge to shrimp. Remember to stop antihistamines 3 days before the challenge.   Continue the medications as listed in your chart  Call the clinic if this treatment plan is not working well for you  Follow up in 6 months or sooner if needed   Return in about 6 months (around 09/05/2019), or if symptoms worsen or fail to improve.   Thank you for the opportunity to care for this patient.  Please do not hesitate to contact me with questions.  Thermon LeylandAnne Ambs, FNP [facility]  I have provided oversight concerning Thermon Leylandnne Ambs' evaluation and treatment of this patient's health issues addressed during today's encounter. I agree with the assessment and therapeutic plan as outlined in the note.   Thank you for the opportunity to care for this patient.  Please do not hesitate  to contact me with questions.  Tonette BihariJ. A. Cabela Pacifico, M.D.  [facility] [address] [place], [place] [phone]

## 2019-03-05 NOTE — Telephone Encounter (Signed)
PCP completed paperwork////copied sent to scan////no charge. Called and informed his mom paperwork done and can pickup at the front desk.

## 2019-04-01 ENCOUNTER — Other Ambulatory Visit: Payer: Self-pay | Admitting: Family Medicine

## 2019-04-03 ENCOUNTER — Other Ambulatory Visit: Payer: Self-pay

## 2019-04-03 ENCOUNTER — Ambulatory Visit (INDEPENDENT_AMBULATORY_CARE_PROVIDER_SITE_OTHER): Payer: 59 | Admitting: Family Medicine

## 2019-04-03 ENCOUNTER — Encounter: Payer: Self-pay | Admitting: Family Medicine

## 2019-04-03 VITALS — BP 126/78 | HR 84 | Temp 97.0°F | Resp 18

## 2019-04-03 DIAGNOSIS — T7800XD Anaphylactic reaction due to unspecified food, subsequent encounter: Secondary | ICD-10-CM | POA: Diagnosis not present

## 2019-04-03 DIAGNOSIS — J4530 Mild persistent asthma, uncomplicated: Secondary | ICD-10-CM

## 2019-04-03 NOTE — Patient Instructions (Addendum)
Food allergy Marvin Gates was able to tolerate the shrimp challenge today at the office without adverse signs or symptoms of an allergic reaction. Therefore, he has the same risk of systemic reaction associated with the consumption of shrimp or products containing shrimp as the general population.  - Do not give any shrimp for the next 24 hours. - Monitor for allergic symptoms such as rash, wheezing, diarrhea, swelling, and vomiting for the next 24 hours. If severe symptoms occur, treat with EpiPen injection and call 911. For less severe symptoms treat with Benadryl 50 mg every 4 hours and call the clinic.  - If no allergic symptoms are evident, reintroduce shrimp into the diet, 1-2 servings a day. If he develops an allergic reaction to shrimp, record what was eaten the amount eaten, preparation method, time from ingestion to reaction, and symptoms.  - Continue to avoid tree nuts, scallops, clam, crab, scallop, oyster, lobster, watermelon and banana. In case of an allergic reaction, take Benadryl 50 mg every 4 hours, and if life-threatening symptoms occur, inject with EpiPen 0.3 mg.  Call the clinic if this treatment plan is not working well for you  Follow up in 4 months or sooner if needed

## 2019-04-03 NOTE — Progress Notes (Signed)
[place] [address] Dept: [phone]  FOLLOW UP NOTE  Patient ID: Marvin Gates, male    DOB: Jun 19, 2001  Age: 18 y.o. MRN: [id number] Date of Office Visit: 04/03/2019  Assessment  Chief Complaint: Food/Drug Challenge  HPI Marvin Gates is a 18 year old male who presents to the clinic for an oral shrimp challenge. He was last seen in this clinic on 03/05/2019 by Dr. Shaune Leeks for evaluation of asthma, allergic rhinitis, and food allergy to shellfish, tree nuts, watermelon, and banana. He had negative percutaneous skin testing to shrimp on 10/02/2017 and negative IgE on 01/12/2018. He denies shortness of breath, cough or wheeze today. He has not had any antihistamines for the last 3 days. His current medications are listed in the chart.    Drug Allergies:  Allergies  Allergen Reactions  . Banana Itching    Mouth itches  . Shellfish Allergy     Positive allergy test  . Wasp Venom Swelling    Physical Exam: BP 126/78   Pulse 84   Temp (!) 97 F (36.1 C)   Resp 18   SpO2 98%    Physical Exam Vitals signs reviewed.  Constitutional:      Appearance: Normal appearance.  HENT:     Head: Normocephalic and atraumatic.     Right Ear: Tympanic membrane normal.     Left Ear: Tympanic membrane normal.     Nose:     Comments: Bilateral nares slightly erythematous with no nasal drainage noted. Pharynx normal. Ears normal. Eyes normal.    Mouth/Throat:     Pharynx: Oropharynx is clear.  Eyes:     Conjunctiva/sclera: Conjunctivae normal.  Neck:     Musculoskeletal: Normal range of motion and neck supple.  Cardiovascular:     Rate and Rhythm: Normal rate and regular rhythm.     Heart sounds: Normal heart sounds. No murmur.  Pulmonary:     Effort: Pulmonary effort is normal.     Breath sounds: Normal breath sounds.  Musculoskeletal: Normal range of motion.  Skin:    General: Skin is warm and dry.  Neurological:     Mental Status: He is alert and oriented to  person, place, and time.  Psychiatric:        Mood and Affect: Mood normal.        Behavior: Behavior normal.        Thought Content: Thought content normal.        Judgment: Judgment normal.     Diagnostics: FVC 6.65, FEV1 5.68. Predicted FVC 5.76, predicted FEV1 4.81. Spirometry indicates normal ventilatory function.   Procedure note: Open graded shrimp oral challenge: The patient was able to tolerate the challenge today without adverse signs or symptoms. Vital signs were stable throughout the challenge and observation period. He received multiple doses separated by 20 minutes, each of which was separated by vitals and a brief physical exam. He received the following doses: lip rub, o.1 oz, 0.3 oz, 0.5 oz, and 1.5 oz.  He was monitored for 60 minutes following the last dose.   The patient had negative skin prick test and sIgE tests to shrimp and was able to tolerate the open graded oral challenge today without adverse signs or symptoms. Therefore, he has the same risk of systemic reaction associated with the consumption of shrimp as the general population.  Assessment and Plan: 1. Mild persistent asthma without complication   2. Anaphylactic reaction due to food, subsequent encounter  Patient Instructions  Food allergy Marvin Gates was able to tolerate the shrimp challenge today at the office without adverse signs or symptoms of an allergic reaction. Therefore, he has the same risk of systemic reaction associated with the consumption of shrimp or products containing shrimp as the general population.  - Do not give any shrimp for the next 24 hours. - Monitor for allergic symptoms such as rash, wheezing, diarrhea, swelling, and vomiting for the next 24 hours. If severe symptoms occur, treat with EpiPen injection and call 911. For less severe symptoms treat with Benadryl 50 mg every 4 hours and call the clinic.  - If no allergic symptoms are evident, reintroduce shrimp into the diet, 1-2  servings a day. If he develops an allergic reaction to shrimp, record what was eaten the amount eaten, preparation method, time from ingestion to reaction, and symptoms.  - Continue to avoid tree nuts, scallops, clam, crab, scallop, oyster, lobster, watermelon and banana. In case of an allergic reaction, take Benadryl 50 mg every 4 hours, and if life-threatening symptoms occur, inject with EpiPen 0.3 mg.  Call the clinic if this treatment plan is not working well for you  Follow up in 4 months or sooner if needed   Return in about 4 months (around 08/03/2019), or if symptoms worsen or fail to improve.    Thank you for the opportunity to care for this patient.  Please do not hesitate to contact me with questions.  Thermon LeylandAnne Clayson Riling, FNP [facility]

## 2019-04-10 ENCOUNTER — Other Ambulatory Visit: Payer: Self-pay | Admitting: Family Medicine

## 2019-04-11 MED ORDER — AMLODIPINE BESYLATE 5 MG PO TABS: 5.0000 mg | ORAL_TABLET | Freq: Every day | ORAL | 2 refills | Status: DC

## 2019-05-03 ENCOUNTER — Other Ambulatory Visit: Payer: Self-pay | Admitting: Pediatrics

## 2019-05-03 ENCOUNTER — Telehealth: Payer: Self-pay | Admitting: Family Medicine

## 2019-05-03 ENCOUNTER — Encounter: Payer: Self-pay | Admitting: Family Medicine

## 2019-05-03 NOTE — Telephone Encounter (Signed)
Called the patient and offered a Virtual but he declined due to cost. He would not elaborate what the question/problem is. I recommended he send PCP a mychart message then to start with. He said he had one question. He agreed to send a [employer]

## 2019-05-03 NOTE — Telephone Encounter (Signed)
Copied from [id number]. Topic: General - Other >> May 03, 2019  9:59 AM Leward Quan A wrote: Reason for CRM: Patient called to request a call back from Dr Nani Ravens states that he have a question about an injury he would like to discuss. Patient did not want to elaborate just asked for a call back please.  Ph# [phone]

## 2019-05-03 NOTE — Telephone Encounter (Signed)
Noted  

## 2019-05-14 ENCOUNTER — Encounter: Payer: Self-pay | Admitting: Family Medicine

## 2019-05-15 ENCOUNTER — Encounter: Payer: Self-pay | Admitting: Family Medicine

## 2019-05-15 ENCOUNTER — Telehealth: Payer: Self-pay | Admitting: Family Medicine

## 2019-05-15 DIAGNOSIS — R41840 Attention and concentration deficit: Secondary | ICD-10-CM

## 2019-05-15 MED ORDER — AMPHETAMINE-DEXTROAMPHET ER 20 MG PO CP24: 20.0000 mg | ORAL_CAPSULE | Freq: Every day | ORAL | 0 refills | Status: DC

## 2019-05-15 NOTE — Telephone Encounter (Signed)
Pt sent mychart message also and needs a rx for adderall xr 20 mg. 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

## 2019-05-15 NOTE — Telephone Encounter (Signed)
Refill request for Adderall. Last OV----02/22/2019 Last RF----04/14/2019---#30 no refills

## 2019-06-11 ENCOUNTER — Other Ambulatory Visit: Payer: Self-pay | Admitting: Family Medicine

## 2019-06-11 ENCOUNTER — Encounter: Payer: Self-pay | Admitting: Family Medicine

## 2019-06-11 DIAGNOSIS — R41840 Attention and concentration deficit: Secondary | ICD-10-CM

## 2019-06-12 ENCOUNTER — Encounter: Payer: Self-pay | Admitting: Family Medicine

## 2019-06-12 MED ORDER — AMPHETAMINE-DEXTROAMPHET ER 20 MG PO CP24: 20.0000 mg | ORAL_CAPSULE | Freq: Every day | ORAL | 0 refills | Status: DC

## 2019-07-18 ENCOUNTER — Encounter: Payer: Self-pay | Admitting: Family Medicine

## 2019-07-19 ENCOUNTER — Other Ambulatory Visit: Payer: Self-pay | Admitting: Family Medicine

## 2019-07-19 DIAGNOSIS — R41840 Attention and concentration deficit: Secondary | ICD-10-CM

## 2019-07-19 MED ORDER — AMPHETAMINE-DEXTROAMPHET ER 20 MG PO CP24: 20.0000 mg | ORAL_CAPSULE | Freq: Every day | ORAL | 0 refills | Status: DC

## 2019-09-20 ENCOUNTER — Encounter: Payer: Self-pay | Admitting: Family Medicine

## 2019-09-20 DIAGNOSIS — R41840 Attention and concentration deficit: Secondary | ICD-10-CM

## 2019-09-21 MED ORDER — AMPHETAMINE-DEXTROAMPHET ER 20 MG PO CP24: 20.0000 mg | ORAL_CAPSULE | Freq: Every day | ORAL | 0 refills | Status: DC

## 2019-09-21 NOTE — Telephone Encounter (Signed)
Due for 6 mo f/u, OK to schedule in next 4-6 weeks. Ty.

## 2019-09-23 ENCOUNTER — Encounter: Payer: Self-pay | Admitting: Family Medicine

## 2019-11-07 ENCOUNTER — Other Ambulatory Visit: Payer: Self-pay | Admitting: Family Medicine

## 2019-11-08 ENCOUNTER — Encounter: Payer: Self-pay | Admitting: Family Medicine

## 2019-11-08 DIAGNOSIS — R41840 Attention and concentration deficit: Secondary | ICD-10-CM

## 2019-11-11 MED ORDER — AMPHETAMINE-DEXTROAMPHET ER 20 MG PO CP24: 20.0000 mg | ORAL_CAPSULE | Freq: Every day | ORAL | 0 refills | Status: DC

## 2019-12-12 ENCOUNTER — Other Ambulatory Visit: Payer: Self-pay | Admitting: Family Medicine

## 2019-12-17 ENCOUNTER — Telehealth: Payer: Self-pay

## 2019-12-17 ENCOUNTER — Other Ambulatory Visit: Payer: Self-pay

## 2019-12-17 MED ORDER — MONTELUKAST SODIUM 10 MG PO TABS: 10.0000 mg | ORAL_TABLET | Freq: Every day | ORAL | 0 refills | Status: DC

## 2019-12-17 NOTE — Telephone Encounter (Signed)
Left message for pt. That montelukast 10 mg was sent in as a courtesy refill and to call office to schedule an appointment to be seen in order to receive montelukast 10 mg refills.

## 2019-12-17 NOTE — Telephone Encounter (Signed)
Courtesy refill only one time. Pt. Needs ov. Will notify the pt. To schedule ov. No more refills until pt. Is seen.

## 2019-12-27 NOTE — Telephone Encounter (Signed)
Spoke to pt. Schedule an appointment with Thermon Leyland, FNP on June 23rd 2021 ok to refill his montelukast 10 mg if needed.

## 2020-01-06 ENCOUNTER — Telehealth: Payer: Self-pay | Admitting: Pediatrics

## 2020-01-06 NOTE — Telephone Encounter (Signed)
PT called wanting to know if he can schedule crab challenge? Previous notes suggested shrimp challenge which was done at last visit.

## 2020-01-07 NOTE — Telephone Encounter (Signed)
Please advise 

## 2020-01-08 NOTE — Telephone Encounter (Signed)
Please advise 

## 2020-01-08 NOTE — Telephone Encounter (Signed)
Lets set him up for a skin prick to crab followed by a challenge. Please remind him to stop antihistamines for 3 days before the testing. Thank you

## 2020-01-09 NOTE — Telephone Encounter (Signed)
Thank you :)

## 2020-01-09 NOTE — Telephone Encounter (Signed)
Lm for pt to call back to schdule

## 2020-01-10 NOTE — Telephone Encounter (Signed)
Thank you :)

## 2020-01-10 NOTE — Telephone Encounter (Signed)
Pt is schduled for Tuesday at 930am prick and challenge

## 2020-01-14 ENCOUNTER — Encounter: Payer: 59 | Admitting: Family Medicine

## 2020-01-16 ENCOUNTER — Encounter: Payer: 59 | Admitting: Family Medicine

## 2020-02-19 ENCOUNTER — Ambulatory Visit: Payer: 59 | Admitting: Family Medicine

## 2020-02-27 ENCOUNTER — Other Ambulatory Visit: Payer: Self-pay | Admitting: Family Medicine

## 2020-03-03 ENCOUNTER — Other Ambulatory Visit: Payer: Self-pay

## 2020-03-03 ENCOUNTER — Encounter: Payer: Self-pay | Admitting: Family Medicine

## 2020-03-03 ENCOUNTER — Ambulatory Visit (INDEPENDENT_AMBULATORY_CARE_PROVIDER_SITE_OTHER): Payer: 59 | Admitting: Family Medicine

## 2020-03-03 VITALS — BP 122/76 | HR 88 | Temp 98.1°F | Resp 16 | Ht 74.6 in | Wt 288.6 lb

## 2020-03-03 DIAGNOSIS — J4530 Mild persistent asthma, uncomplicated: Secondary | ICD-10-CM | POA: Diagnosis not present

## 2020-03-03 DIAGNOSIS — T63481D Toxic effect of venom of other arthropod, accidental (unintentional), subsequent encounter: Secondary | ICD-10-CM

## 2020-03-03 DIAGNOSIS — T7800XD Anaphylactic reaction due to unspecified food, subsequent encounter: Secondary | ICD-10-CM | POA: Diagnosis not present

## 2020-03-03 DIAGNOSIS — J301 Allergic rhinitis due to pollen: Secondary | ICD-10-CM | POA: Diagnosis not present

## 2020-03-03 DIAGNOSIS — T781XXD Other adverse food reactions, not elsewhere classified, subsequent encounter: Secondary | ICD-10-CM

## 2020-03-03 MED ORDER — EPINEPHRINE 0.3 MG/0.3ML IJ SOAJ: INTRAMUSCULAR | 2 refills | Status: DC

## 2020-03-03 MED ORDER — MONTELUKAST SODIUM 10 MG PO TABS: 10.0000 mg | ORAL_TABLET | Freq: Every day | ORAL | 5 refills | Status: DC

## 2020-03-03 NOTE — Patient Instructions (Addendum)
Asthma Continue montelukast 10 mg-take 1 tablet once a day to prevent coughing or wheezing Continue ProAir (red inhaler) 2 puffs every 4 hours if needed for cough or wheeze. Use ProAir 5-15 minutes before exercise to reduce cough or wheeze For ashtma flare, begin Flovent 110 (orange/brown inhaler). Use 2 puffs twice a day with a spacer for 2 weeks or until cough or wheeze free, then stop  Allergic rhinitis Continue cetirizine 10 mg-take 1 tablet once a day if needed for runny nose or itchy eyes Continue fluticasone 2 sprays per nostril once a day if needed for stuffy nose  Consider saline nasal rinses as needed for nasal symptoms. Use this before any medicated nasal sprays for best result  Food allergy Continue to avoid mollusks, tree nuts, and bananas. If you have an allergic reaction take Benadryl 50 mg every 4 hours and if you have life-threatening symptoms inject with EpiPen 0.3 mg  Oral allergy syndrome Continue to avoid foods that irritate your mouth. If you have an allergic reaction take Benadryl 50 mg every 4 hours and if you have life-threatening symptoms inject with EpiPen 0.3 mg  Stinging insect allergy Continue to avoid stinging insects. If you have an allergic reaction take Benadryl 50 mg every 4 hours and if you have life-threatening symptoms inject with EpiPen 0.3 mg  Continue the medications as listed in your chart  Call the clinic if this treatment plan is not working well for you  Follow up in 1 year or sooner if needed

## 2020-03-03 NOTE — Progress Notes (Addendum)
[address] [place] [address] Dept: [phone]  FOLLOW UP NOTE  Patient ID: Marvin Gates, male    DOB: [birth year]  Age: 19 y.o. MRN: [id number] Date of Office Visit: 03/03/2020  Assessment  Chief Complaint: Food Intolerance  HPI Marvin Gates is a 19 year old male who presents to the clinic for follow-up visit.  He was last seen in this clinic on 04/03/2019 for an office food challenge to shrimp.  Prior to that he was seen on 03/05/2019 by Dr. Beaulah Dinning for evaluation of asthma, allergic rhinitis, oral allergy syndrome, stinging insect allergy, and food allergy to shellfish, tree nut, watermelon, and banana.  At today's visit, he reports asthma has been well controlled with no shortness of breath, cough, or wheeze with activity or rest.  He continues montelukast 10 mg once a day and uses albuterol about once every 6 months.  He reports that he has not used Flovent 110 for at least 1 year.  Allergic rhinitis is reported as moderately well controlled with nasal congestion during pollen season for which he uses Flonase, nasal saline spray, and cetirizine all as needed.  He reports that he continues eating shrimp, crab, and watermelon with no adverse reaction.  He continues to avoid mollusks, tree nuts, and banana with no accidental ingestion or epinephrine use since his last visit to this clinic.  He reports he has not had any interaction with stinging insects and has not needed to use his EpiPen since his last visit to this clinic.  His current medications are listed in the chart.  Drug Allergies:  Allergies  Allergen Reactions  . Banana Itching    Mouth itches  . Wasp Venom Swelling    Physical Exam: BP 122/76   Pulse 88   Temp 98.1 F (36.7 C) (Oral)   Resp 16   Ht 6' 2.6" (1.895 m)   Wt 288 lb 9.6 oz (130.9 kg)   SpO2 98%   BMI 36.46 kg/m    Physical Exam Vitals reviewed.  Constitutional:      Appearance: Normal appearance.  HENT:     Head: Normocephalic and  atraumatic.     Right Ear: Tympanic membrane normal.     Left Ear: Tympanic membrane normal.     Nose:     Comments: Bilateral nares normal.  Pharynx normal.  Ears normal.  Eyes normal.    Mouth/Throat:     Pharynx: Oropharynx is clear.  Eyes:     Conjunctiva/sclera: Conjunctivae normal.  Cardiovascular:     Rate and Rhythm: Normal rate and regular rhythm.     Heart sounds: Normal heart sounds. No murmur heard.   Pulmonary:     Effort: Pulmonary effort is normal.     Breath sounds: Normal breath sounds.     Comments: Lungs clear to auscultation Musculoskeletal:        General: Normal range of motion.     Cervical back: Normal range of motion and neck supple.  Skin:    General: Skin is warm and dry.  Neurological:     Mental Status: He is alert and oriented to person, place, and time.  Psychiatric:        Mood and Affect: Mood normal.        Behavior: Behavior normal.        Thought Content: Thought content normal.        Judgment: Judgment normal.     Diagnostics: FVC 6.59, FEV1 5.64.  Predicted FVC 6.51, predicted  FEV1 5.37.  Spirometry indicates normal ventilatory function.  Assessment and Plan: 1. Mild persistent asthma without complication   2. Seasonal allergic rhinitis due to pollen   3. Pollen-food allergy, subsequent encounter   4. Anaphylactic reaction due to food, subsequent encounter   5. Allergic reaction to insect sting, accidental or unintentional, subsequent encounter   Is all but none is being started.  Will evaluate  Meds ordered this encounter  Medications  . EPINEPHrine (EPIPEN 2-PAK) 0.3 mg/0.3 mL IJ SOAJ injection    Sig: Use as directed for severe allergic reaction    Dispense:  2 each    Refill:  2    Give patient 2-2 pak. One for school and one for home  . montelukast (SINGULAIR) 10 MG tablet    Sig: Take 1 tablet (10 mg total) by mouth at bedtime.    Dispense:  30 tablet    Refill:  5    Patient Instructions  Asthma Continue  montelukast 10 mg-take 1 tablet once a day to prevent coughing or wheezing Continue ProAir (red inhaler) 2 puffs every 4 hours if needed for cough or wheeze. Use ProAir 5-15 minutes before exercise to reduce cough or wheeze For ashtma flare, begin Flovent 110 (orange/brown inhaler). Use 2 puffs twice a day with a spacer for 2 weeks or until cough or wheeze free, then stop  Allergic rhinitis Continue cetirizine 10 mg-take 1 tablet once a day if needed for runny nose or itchy eyes Continue fluticasone 2 sprays per nostril once a day if needed for stuffy nose  Consider saline nasal rinses as needed for nasal symptoms. Use this before any medicated nasal sprays for best result  Food allergy Continue to avoid mollusks, tree nuts, and bananas. If you have an allergic reaction take Benadryl 50 mg every 4 hours and if you have life-threatening symptoms inject with EpiPen 0.3 mg  Oral allergy syndrome Continue to avoid foods that irritate your mouth. If you have an allergic reaction take Benadryl 50 mg every 4 hours and if you have life-threatening symptoms inject with EpiPen 0.3 mg  Stinging insect allergy Continue to avoid stinging insects. If you have an allergic reaction take Benadryl 50 mg every 4 hours and if you have life-threatening symptoms inject with EpiPen 0.3 mg  Continue the medications as listed in your chart  Call the clinic if this treatment plan is not working well for you  Follow up in 1 year or sooner if needed   Return in about 1 year (around 03/03/2021), or if symptoms worsen or fail to improve.    Thank you for the opportunity to care for this patient.  Please do not hesitate to contact me with questions.  Thermon Leyland, FNP [facility]  ________________________________________________  I have provided oversight concerning Marvin Gates's evaluation and treatment of this patient's health issues addressed during today's encounter.  I agree with the  assessment and therapeutic plan as outlined in the note.   Signed,   R Jorene Guest, MD

## 2020-03-13 ENCOUNTER — Encounter: Payer: Self-pay | Admitting: Family Medicine

## 2020-03-13 ENCOUNTER — Other Ambulatory Visit: Payer: Self-pay

## 2020-03-13 ENCOUNTER — Ambulatory Visit (INDEPENDENT_AMBULATORY_CARE_PROVIDER_SITE_OTHER): Payer: 59 | Admitting: Family Medicine

## 2020-03-13 VITALS — BP 120/70 | HR 95 | Temp 98.0°F | Ht 75.0 in | Wt 291.0 lb

## 2020-03-13 DIAGNOSIS — Z0000 Encounter for general adult medical examination without abnormal findings: Secondary | ICD-10-CM | POA: Diagnosis not present

## 2020-03-13 DIAGNOSIS — Z1159 Encounter for screening for other viral diseases: Secondary | ICD-10-CM

## 2020-03-13 MED ORDER — AMLODIPINE BESYLATE 5 MG PO TABS: 5.0000 mg | ORAL_TABLET | Freq: Every day | ORAL | 2 refills | Status: DC

## 2020-03-13 NOTE — Progress Notes (Signed)
Chief Complaint  Patient presents with  . Annual Exam    Well Male Marvin Gates is here for a complete physical.   His last physical was >1 year ago.  Current diet: in general, a "healthy" diet.   Current exercise: swimming Weight trend: stable Fatigue out of ordinary? No. Seat belt? Yes.    Health maintenance Tetanus- Yes HIV- Yes Hep C- No  Past Medical History:  Diagnosis Date  . Allergic rhinitis   . Asthma   . Food allergy      Past Surgical History:  Procedure Laterality Date  . TONSILLECTOMY      Medications  Current Outpatient Medications on File Prior to Visit  Medication Sig Dispense Refill  . albuterol (PROAIR HFA) 108 (90 Base) MCG/ACT inhaler Inhale 2 puffs into the lungs every 4 (four) hours as needed for wheezing or shortness of breath. 18 g 5  . amphetamine-dextroamphetamine (ADDERALL XR) 20 MG 24 hr capsule Take 1 capsule (20 mg total) by mouth daily. 30 capsule 0  . buPROPion (WELLBUTRIN XL) 300 MG 24 hr tablet Take by mouth.    . cetirizine (ZYRTEC) 10 MG tablet Take 1 tablet (10 mg total) by mouth daily as needed for allergies. 31 tablet 5  . EPINEPHrine (EPIPEN 2-PAK) 0.3 mg/0.3 mL IJ SOAJ injection Use as directed for severe allergic reaction 2 each 2  . FLOVENT HFA 110 MCG/ACT inhaler FOR ASTHMA FLARES TAKE 2 PUFFS TWICE A DAY WITH A SPACER FOR 2 WEEKS OR UNTIL COUGH AND WHEEZE FREE 12 g 5  . montelukast (SINGULAIR) 10 MG tablet Take 1 tablet (10 mg total) by mouth at bedtime. 30 tablet 5   Allergies Allergies  Allergen Reactions  . Banana Itching    Mouth itches  . Wasp Venom Swelling    Family History Family History  Problem Relation Age of Onset  . Asthma Mother   . Allergic rhinitis Mother   . Food Allergy Mother        shellfish allergy  . Diabetes Mother   . High blood pressure Mother   . Diabetes Father   . High blood pressure Father   . Angioedema Neg Hx   . Eczema Neg Hx   . Immunodeficiency Neg Hx   . Urticaria Neg Hx      Review of Systems: Constitutional: no fevers or chills Eye:  no recent significant change in vision Ear/Nose/Mouth/Throat:  Ears:  no hearing loss Nose/Mouth/Throat:  no complaints of nasal congestion, no sore throat Cardiovascular:  no chest pain Respiratory:  no shortness of breath Gastrointestinal:  no abdominal pain, no change in bowel habits GU:  Male: negative for dysuria Musculoskeletal/Extremities:  No new pain of the joints Integumentary (Skin/Breast):  no abnormal skin lesions reported Neurologic:  no headaches Endocrine: No unexpected weight changes Hematologic/Lymphatic:  no night sweats  Exam BP 120/70 (BP Location: Left Arm, Patient Position: Sitting, Cuff Size: Large)   Pulse 95   Temp 98 F (36.7 C) (Oral)   Ht 6\' 3"  (1.905 m)   Wt 291 lb (132 kg)   SpO2 97%   BMI 36.37 kg/m  General:  well developed, well nourished, in no apparent distress Skin:  no significant moles, warts, or growths Head:  no masses, lesions, or tenderness Eyes:  pupils equal and round, sclera anicteric without injection Ears:  canals without lesions, TMs shiny without retraction, no obvious effusion, no erythema Nose:  nares patent, septum midline, mucosa normal Throat/Pharynx:  lips and gingiva  without lesion; tongue and uvula midline; non-inflamed pharynx; no exudates or postnasal drainage Neck: neck supple without adenopathy, thyromegaly, or masses Lungs:  clear to auscultation, breath sounds equal bilaterally, no respiratory distress Cardio:  regular rate and rhythm, no bruits, no LE edema Abdomen:  abdomen soft, nontender; bowel sounds normal; no masses or organomegaly Rectal: Deferred Musculoskeletal:  symmetrical muscle groups noted without atrophy or deformity Extremities:  no clubbing, cyanosis, or edema, no deformities, no skin discoloration Neuro:  gait normal; deep tendon reflexes normal and symmetric Psych: well oriented with normal range of affect and appropriate  judgment/insight  Assessment and Plan  Well adult exam - Plan: CBC, Comprehensive metabolic panel, Lipid panel  Encounter for hepatitis C screening test for low risk patient - Plan: Hepatitis C antibody   Well 19 y.o. male. Counseled on diet and exercise. Self testicular exams recommended at least monthly.  Other orders as above. Future labs.  Follow up in 6 mo pending the above workup. The patient voiced understanding and agreement to the plan.  Marvin Roche [name], DO 03/13/20 3:10 PM

## 2020-03-13 NOTE — Patient Instructions (Signed)
We would start screening Dameian for colon cancer when he turns 19 years old. I can order a Cologard test to be sent in the mail, but insurance would not cover this at his age. That being, I am willing to order it if everyone is OK with this.  Give [place] 2-3 business days to get the results of your labs back.   Keep the diet clean and stay active.  Do monthly self testicular checks in the shower. You are feeling for lumps/bumps that don't belong. If you feel anything like this, let me know!  Let us know if you need anything.

## 2020-03-18 ENCOUNTER — Other Ambulatory Visit: Payer: Self-pay

## 2020-03-18 ENCOUNTER — Ambulatory Visit (INDEPENDENT_AMBULATORY_CARE_PROVIDER_SITE_OTHER): Payer: 59 | Admitting: *Deleted

## 2020-03-18 ENCOUNTER — Other Ambulatory Visit (INDEPENDENT_AMBULATORY_CARE_PROVIDER_SITE_OTHER): Payer: 59

## 2020-03-18 ENCOUNTER — Other Ambulatory Visit: Payer: Self-pay | Admitting: Family Medicine

## 2020-03-18 DIAGNOSIS — Z1159 Encounter for screening for other viral diseases: Secondary | ICD-10-CM

## 2020-03-18 DIAGNOSIS — Z0000 Encounter for general adult medical examination without abnormal findings: Secondary | ICD-10-CM | POA: Diagnosis not present

## 2020-03-18 DIAGNOSIS — Z23 Encounter for immunization: Secondary | ICD-10-CM

## 2020-03-18 DIAGNOSIS — E785 Hyperlipidemia, unspecified: Secondary | ICD-10-CM

## 2020-03-18 LAB — LIPID PANEL
Cholesterol: 194 mg/dL (ref 0–200)
HDL: 31.2 mg/dL — ABNORMAL LOW (ref >39.00)
LDL Cholesterol: 125 mg/dL — ABNORMAL HIGH (ref 0–99)
NonHDL: 163.08
Total CHOL/HDL Ratio: 6
Triglycerides: 192 mg/dL — ABNORMAL HIGH (ref 0.0–149.0)
VLDL: 38.4 mg/dL (ref 0.0–40.0)

## 2020-03-18 LAB — CBC
HCT: 41.1 % (ref 39.0–52.0)
Hemoglobin: 14.2 g/dL (ref 13.0–17.0)
MCHC: 34.7 g/dL (ref 30.0–36.0)
MCV: 92.6 fl (ref 78.0–100.0)
Platelets: 214 10*3/uL (ref 150.0–400.0)
RBC: 4.44 Mil/uL (ref 4.22–5.81)
RDW: 12.3 % (ref 11.5–14.6)
WBC: 7.5 10*3/uL (ref 4.5–10.5)

## 2020-03-18 LAB — COMPREHENSIVE METABOLIC PANEL
ALT: 23 U/L (ref 0–53)
AST: 14 U/L (ref 0–37)
Albumin: 4.6 g/dL (ref 3.5–5.2)
Alkaline Phosphatase: 50 U/L (ref 39–117)
BUN: 11 mg/dL (ref 6–23)
CO2: 30 mEq/L (ref 19–32)
Calcium: 9.7 mg/dL (ref 8.4–10.5)
Chloride: 104 mEq/L (ref 96–112)
Creatinine, Ser: 0.97 mg/dL (ref 0.40–1.50)
GFR: 97.98 mL/min (ref >60.00)
Glucose, Bld: 88 mg/dL (ref 70–99)
Potassium: 4.1 mEq/L (ref 3.5–5.1)
Sodium: 139 mEq/L (ref 135–145)
Total Bilirubin: 0.4 mg/dL (ref 0.2–1.2)
Total Protein: 6.8 g/dL (ref 6.0–8.3)

## 2020-03-18 NOTE — Progress Notes (Signed)
.  li

## 2020-03-18 NOTE — Progress Notes (Signed)
Patient here for Bexsero and Gardasil vaccines.  Bexsero given in left deltoid and Gardasil given in right arm.  Patient tolerated both injections well.

## 2020-03-19 LAB — HEPATITIS C ANTIBODY
Hepatitis C Ab: NONREACTIVE
SIGNAL TO CUT-OFF: 0.01 (ref <1.00)

## 2020-04-25 ENCOUNTER — Telehealth: Payer: Self-pay

## 2020-04-25 DIAGNOSIS — R41840 Attention and concentration deficit: Secondary | ICD-10-CM

## 2020-04-27 MED ORDER — AMPHETAMINE-DEXTROAMPHET ER 20 MG PO CP24: 20.0000 mg | ORAL_CAPSULE | Freq: Every day | ORAL | 0 refills | Status: DC

## 2020-04-27 NOTE — Telephone Encounter (Signed)
Last written: 11/11/19  Last ov: 02/22/19 Next ov: none Contract:  none UDS: none

## 2020-04-28 NOTE — Addendum Note (Signed)
Addended by: Mervin Kung A on: 04/28/2020 09:45 AM   Modules accepted: Orders

## 2020-04-29 ENCOUNTER — Other Ambulatory Visit: Payer: Self-pay

## 2020-04-29 ENCOUNTER — Other Ambulatory Visit (INDEPENDENT_AMBULATORY_CARE_PROVIDER_SITE_OTHER): Payer: 59

## 2020-04-29 DIAGNOSIS — E785 Hyperlipidemia, unspecified: Secondary | ICD-10-CM

## 2020-04-29 MED ORDER — AMPHETAMINE-DEXTROAMPHET ER 20 MG PO CP24: 20.0000 mg | ORAL_CAPSULE | Freq: Every day | ORAL | 0 refills | Status: DC

## 2020-04-29 NOTE — Telephone Encounter (Signed)
Pt came in office to do labs and stated is needing to change on chart his pharmacy, pharmacy should be [facility] on [address] in [place] and NOT the [facility] in [place], please update on pt's chart.

## 2020-04-30 LAB — LIPID PANEL
Cholesterol: 221 mg/dL — ABNORMAL HIGH (ref <200)
HDL: 32 mg/dL — ABNORMAL LOW (ref >=40)
LDL Cholesterol (Calc): 145 mg/dL (calc) — ABNORMAL HIGH
Non-HDL Cholesterol (Calc): 189 mg/dL (calc) — ABNORMAL HIGH (ref <130)
Total CHOL/HDL Ratio: 6.9 (calc) — ABNORMAL HIGH (ref <5.0)
Triglycerides: 286 mg/dL — ABNORMAL HIGH (ref <150)

## 2020-05-03 ENCOUNTER — Other Ambulatory Visit: Payer: Self-pay | Admitting: Family Medicine

## 2020-05-07 MED ORDER — ROSUVASTATIN CALCIUM 10 MG PO TABS: 10.0000 mg | ORAL_TABLET | Freq: Every day | ORAL | 3 refills | Status: DC

## 2020-05-08 ENCOUNTER — Other Ambulatory Visit: Payer: Self-pay | Admitting: Family Medicine

## 2020-05-08 DIAGNOSIS — E785 Hyperlipidemia, unspecified: Secondary | ICD-10-CM

## 2020-05-11 ENCOUNTER — Ambulatory Visit: Payer: 59 | Admitting: Family Medicine

## 2020-05-12 ENCOUNTER — Ambulatory Visit: Payer: 59 | Admitting: Family Medicine

## 2020-05-12 DIAGNOSIS — Z0289 Encounter for other administrative examinations: Secondary | ICD-10-CM

## 2020-05-13 MED ORDER — AMLODIPINE BESYLATE 5 MG PO TABS
5.0000 mg | ORAL_TABLET | Freq: Every day | ORAL | 2 refills | Status: DC
Start: 2020-05-13 — End: 2021-02-15

## 2020-10-01 ENCOUNTER — Other Ambulatory Visit: Payer: Self-pay | Admitting: Family Medicine

## 2020-10-21 MED ORDER — BUPROPION HCL ER (XL) 300 MG PO TB24: 300.0000 mg | ORAL_TABLET | Freq: Every day | ORAL | 1 refills | Status: DC

## 2020-11-01 ENCOUNTER — Other Ambulatory Visit: Payer: Self-pay | Admitting: Family Medicine

## 2020-11-17 ENCOUNTER — Ambulatory Visit: Payer: 59 | Admitting: Family Medicine

## 2020-11-17 NOTE — Progress Notes (Signed)
Follow Up Note  RE: Marvin Gates MRN: [id number] DOB: [birth year] Date of Office Visit: 11/18/2020  Referring provider: Sharlene Dory* Primary care provider: Sharlene Dory, DO  Chief Complaint: Allergies (Dark yellow mucus, sinus headache, sore throat, nasal drainage x 2 days )  History of Present Illness: I had the pleasure of seeing Marvin Gates for a follow up visit at the [facility] on 11/18/2020. He is a 20 y.o. male, who is being followed for asthma, allergic rhinitis, food allergy, oral allergy syndrome and stinging insect allergy. His previous allergy office visit was on 03/03/2020 with Thermon Leyland, FNP. Today is a regular follow up visit.  Sinus: The last 2 days had increased PND with sore throat, coughing with sputum, sinus pressure. Mucous is dark yellow in color.  Denies any fevers/chills.   Patient took some Emergen-C,chicken noodle soup. Not using any nasal sprays.  Asthma ACT score 22. Denies any SOB, wheezing, chest tightness, nocturnal awakenings, ER/urgent care visits or prednisone use since the last visit. No inhaler use.  Takes Singulair daily.   Allergic rhinitis Takes zyrtec and Singulair daily.  Food allergy Currently avoiding mollusks, tree nuts, and bananas. No reactions since the last visit.  Stinging insect allergy No stings since last visit.  Assessment and Plan: Marvin Gates is a 20 y.o. male with: Viral upper respiratory infection 2 days of symptoms with no fevers. Not taking anything OTC.  He most likely has a viral URI.  USE Flonase (fluticasone) nasal spray 1 spray per nostril twice a day for nasal congestion.   May use azelastine nasal spray 1-2 sprays per nostril twice a day as needed for runny nose/drainage.  Take mucinex twice a day with plenty of water to break up the mucous  Nasal saline spray (i.e., Simply Saline) or nasal saline lavage (i.e., NeilMed) is recommended as needed and prior to medicated  nasal sprays.  If NOT better by the weekend then start Augmentin 875mg  twice a day for 10 days.   Mild persistent asthma without complication Well-controlled.  ACT score 22. Today's spirometry as normal. . Daily controller medication(s): Continue Singulair (montelukast) 10mg  daily at night. . May use albuterol rescue inhaler 2 puffs every 4 to 6 hours as needed for shortness of breath, chest tightness, coughing, and wheezing. May use albuterol rescue inhaler 2 puffs 5 to 15 minutes prior to strenuous physical activities. Monitor frequency of use.   Other allergic rhinitis No recent testing.  Continue environmental control measures.  May use over the counter antihistamines such as Zyrtec (cetirizine), Claritin (loratadine), Allegra (fexofenadine), or Xyzal (levocetirizine) daily as needed.  Continue Singulair (montelukast) 10mg  daily at night.  Anaphylactic reaction due to food, subsequent encounter No reactions since last visit.  Continue to avoid mollusks, tree nuts and bananas.  For mild symptoms you can take over the counter antihistamines such as Benadryl and monitor symptoms closely. If symptoms worsen or if you have severe symptoms including breathing issues, throat closure, significant swelling, whole body hives, severe diarrhea and vomiting, lightheadedness then inject epinephrine and seek immediate medical care afterwards.  Action plan in place.  History of insect sting allergy No recent stings.  Continue to avoid and have Epinephrine injectable device on hand as needed for anaphylactic reactions.  Return in about 6 months (around 05/21/2021).  Meds ordered this encounter  Medications  . montelukast (SINGULAIR) 10 MG tablet    Sig: Take 1 tablet (10 mg total) by mouth at bedtime.  Dispense:  90 tablet    Refill:  3    Dispense 90 day supply  . azelastine (ASTELIN) 0.1 % nasal spray    Sig: Place 1-2 sprays into both nostrils 2 (two) times daily as needed (nasal  drainage). Use in each nostril as directed    Dispense:  30 mL    Refill:  5  . amoxicillin-clavulanate (AUGMENTIN) 875-125 MG tablet    Sig: Take 1 tablet by mouth 2 (two) times daily.    Dispense:  20 tablet    Refill:  0    PLEASE place on file for now. Patient will request if needed. Thank you.   Lab Orders  No laboratory test(s) ordered today    Diagnostics: Spirometry:  Tracings reviewed. His effort: Good reproducible efforts. FVC: 6.27L FEV1: 5.57L, 107% predicted FEV1/FVC ratio: 89% Interpretation: Spirometry consistent with normal pattern.  Please see scanned spirometry results for details.  Medication List:  Current Outpatient Medications  Medication Sig Dispense Refill  . albuterol (PROAIR HFA) 108 (90 Base) MCG/ACT inhaler Inhale 2 puffs into the lungs every 4 (four) hours as needed for wheezing or shortness of breath. 18 g 5  . amLODipine (NORVASC) 5 MG tablet Take 1 tablet (5 mg total) by mouth daily. 90 tablet 2  . amoxicillin-clavulanate (AUGMENTIN) 875-125 MG tablet Take 1 tablet by mouth 2 (two) times daily. 20 tablet 0  . azelastine (ASTELIN) 0.1 % nasal spray Place 1-2 sprays into both nostrils 2 (two) times daily as needed (nasal drainage). Use in each nostril as directed 30 mL 5  . buPROPion (WELLBUTRIN XL) 300 MG 24 hr tablet Take 1 tablet (300 mg total) by mouth daily. 90 tablet 1  . cetirizine (ZYRTEC) 10 MG tablet Take 1 tablet (10 mg total) by mouth daily as needed for allergies. 31 tablet 5  . EPINEPHrine (EPIPEN 2-PAK) 0.3 mg/0.3 mL IJ SOAJ injection Use as directed for severe allergic reaction 2 each 2  . montelukast (SINGULAIR) 10 MG tablet Take 1 tablet (10 mg total) by mouth at bedtime. 90 tablet 3  . rosuvastatin (CRESTOR) 10 MG tablet TAKE 1 TABLET(10 MG) BY MOUTH DAILY 30 tablet 0  . amphetamine-dextroamphetamine (ADDERALL XR) 20 MG 24 hr capsule Take 1 capsule (20 mg total) by mouth daily. (Patient not taking: Reported on 11/18/2020) 30 capsule 0   . FLOVENT HFA 110 MCG/ACT inhaler FOR ASTHMA FLARES TAKE 2 PUFFS TWICE A DAY WITH A SPACER FOR 2 WEEKS OR UNTIL COUGH AND WHEEZE FREE (Patient not taking: Reported on 11/18/2020) 12 g 5   No current facility-administered medications for this visit.   Allergies: Allergies  Allergen Reactions  . Banana Itching    Mouth itches  . Wasp Venom Swelling   I reviewed his past medical history, social history, family history, and environmental history and no significant changes have been reported from his previous visit.  Review of Systems  Constitutional: Negative for appetite change, chills, fever and unexpected weight change.  HENT: Positive for congestion, postnasal drip, rhinorrhea and sore throat.   Eyes: Negative for itching.  Respiratory: Positive for cough. Negative for chest tightness, shortness of breath and wheezing.   Gastrointestinal: Negative for abdominal pain.  Skin: Negative for rash.  Allergic/Immunologic: Positive for environmental allergies and food allergies.  Neurological: Negative for headaches.   Objective: BP (!) 146/82   Pulse 94   Temp 98.3 F (36.8 C) (Temporal)   Resp 16   Ht 6' 2.25" (1.886 m)   Wt  299 lb (135.6 kg)   SpO2 97%   BMI 38.13 kg/m  Body mass index is 38.13 kg/m. Physical Exam Vitals and nursing note reviewed.  Constitutional:      Appearance: Normal appearance. He is well-developed.  HENT:     Head: Normocephalic and atraumatic.     Right Ear: Tympanic membrane and external ear normal.     Left Ear: Tympanic membrane and external ear normal.     Nose: Congestion and rhinorrhea present.     Comments: On right side    Mouth/Throat:     Mouth: Mucous membranes are moist.     Pharynx: Oropharynx is clear.  Eyes:     Conjunctiva/sclera: Conjunctivae normal.  Cardiovascular:     Rate and Rhythm: Normal rate and regular rhythm.     Heart sounds: Normal heart sounds. No murmur heard.   Pulmonary:     Effort: Pulmonary effort is  normal.     Breath sounds: Normal breath sounds. No wheezing, rhonchi or rales.  Musculoskeletal:     Cervical back: Neck supple.  Skin:    General: Skin is warm.     Findings: No rash.  Neurological:     Mental Status: He is alert and oriented to person, place, and time.  Psychiatric:        Behavior: Behavior normal.    Previous notes and tests were reviewed. The plan was reviewed with the patient/family, and all questions/concerned were addressed.  It was my pleasure to see Marvin Gates today and participate in his care. Please feel free to contact me with any questions or concerns.  Sincerely,  Wyline Mood, DO Allergy & Immunology  [facility] office: [phone] [facility] office: [phone]

## 2020-11-18 ENCOUNTER — Ambulatory Visit: Payer: 59 | Admitting: Allergy

## 2020-11-18 ENCOUNTER — Encounter: Payer: Self-pay | Admitting: Allergy

## 2020-11-18 ENCOUNTER — Other Ambulatory Visit: Payer: Self-pay

## 2020-11-18 VITALS — BP 146/82 | HR 94 | Temp 98.3°F | Resp 16 | Ht 74.25 in | Wt 299.0 lb

## 2020-11-18 DIAGNOSIS — J301 Allergic rhinitis due to pollen: Secondary | ICD-10-CM | POA: Diagnosis not present

## 2020-11-18 DIAGNOSIS — J069 Acute upper respiratory infection, unspecified: Secondary | ICD-10-CM | POA: Diagnosis not present

## 2020-11-18 DIAGNOSIS — J4530 Mild persistent asthma, uncomplicated: Secondary | ICD-10-CM | POA: Diagnosis not present

## 2020-11-18 DIAGNOSIS — J3089 Other allergic rhinitis: Secondary | ICD-10-CM

## 2020-11-18 DIAGNOSIS — T781XXD Other adverse food reactions, not elsewhere classified, subsequent encounter: Secondary | ICD-10-CM | POA: Diagnosis not present

## 2020-11-18 DIAGNOSIS — T7800XD Anaphylactic reaction due to unspecified food, subsequent encounter: Secondary | ICD-10-CM

## 2020-11-18 DIAGNOSIS — Z91038 Other insect allergy status: Secondary | ICD-10-CM

## 2020-11-18 MED ORDER — MONTELUKAST SODIUM 10 MG PO TABS: 10.0000 mg | ORAL_TABLET | Freq: Every day | ORAL | 3 refills | Status: DC

## 2020-11-18 MED ORDER — AMOXICILLIN-POT CLAVULANATE 875-125 MG PO TABS: 1.0000 | ORAL_TABLET | Freq: Two times a day (BID) | ORAL | 0 refills | Status: DC

## 2020-11-18 MED ORDER — AZELASTINE HCL 0.1 % NA SOLN: 1.0000 | Freq: Two times a day (BID) | NASAL | 5 refills | Status: DC | PRN

## 2020-11-18 NOTE — Assessment & Plan Note (Addendum)
No recent stings.  Continue to avoid and have Epinephrine injectable device on hand as needed for anaphylactic reactions.

## 2020-11-18 NOTE — Assessment & Plan Note (Signed)
No reactions since last visit.  Continue to avoid mollusks, tree nuts and bananas.  For mild symptoms you can take over the counter antihistamines such as Benadryl and monitor symptoms closely. If symptoms worsen or if you have severe symptoms including breathing issues, throat closure, significant swelling, whole body hives, severe diarrhea and vomiting, lightheadedness then inject epinephrine and seek immediate medical care afterwards.  Action plan in place.

## 2020-11-18 NOTE — Assessment & Plan Note (Signed)
No recent testing.  Continue environmental control measures.  May use over the counter antihistamines such as Zyrtec (cetirizine), Claritin (loratadine), Allegra (fexofenadine), or Xyzal (levocetirizine) daily as needed.  Continue Singulair (montelukast) 10mg  daily at night.

## 2020-11-18 NOTE — Patient Instructions (Addendum)
Sinuses   You most likely have a viral cold.   USE Flonase (fluticasone) nasal spray 1 spray per nostril twice a day for nasal congestion.   May use azelastine nasal spray 1-2 sprays per nostril twice a day as needed for runny nose/drainage.  Take mucinex twice a day with plenty of water to break up the mucous  Nasal saline spray (i.e., Simply Saline) or nasal saline lavage (i.e., NeilMed) is recommended as needed and prior to medicated nasal sprays.  If NOT better by the weekend then you can start Augmentin 875mg  twice a day for 10 days.   Take probiotics or yogurt with the antibiotics.   Asthma: . Daily controller medication(s): Continue Singulair (montelukast) 10mg  daily at night. . May use albuterol rescue inhaler 2 puffs every 4 to 6 hours as needed for shortness of breath, chest tightness, coughing, and wheezing. May use albuterol rescue inhaler 2 puffs 5 to 15 minutes prior to strenuous physical activities. Monitor frequency of use.  . Asthma control goals:  o Full participation in all desired activities (may need albuterol before activity) o Albuterol use two times or less a week on average (not counting use with activity) o Cough interfering with sleep two times or less a month o Oral steroids no more than once a year o No hospitalizations  Allergic rhinitis  Continue environmental control measures.  May use over the counter antihistamines such as Zyrtec (cetirizine), Claritin (loratadine), Allegra (fexofenadine), or Xyzal (levocetirizine) daily as needed.  Continue Singulair (montelukast) 10mg  daily at night.  Food allergy  Continue to avoid mollusks, tree nuts and bananas.  For mild symptoms you can take over the counter antihistamines such as Benadryl and monitor symptoms closely. If symptoms worsen or if you have severe symptoms including breathing issues, throat closure, significant swelling, whole body hives, severe diarrhea and vomiting, lightheadedness then  inject epinephrine and seek immediate medical care afterwards.  Action plan in place.   Bee sting allergy  Continue to avoid   Follow up in 6 months or sooner if needed.     Drink plenty of fluids.  Water, juice, clear broth or warm lemon water are good choices. Avoid caffeine and alcohol, which can dehydrate you.  Eat chicken soup.  Chicken soup and other warm fluids can be soothing and loosen congestion.  Rest.  Adjust your room's temperature and humidity.  Keep your room warm but not overheated. If the air is dry, a cool-mist humidifier or vaporizer can moisten the air and help ease congestion and coughing. Keep the humidifier clean to prevent the growth of bacteria and molds.  Soothe your throat.  Perform a saltwater gargle. Dissolve one-quarter to a half teaspoon of salt in a 4- to 8-ounce glass of warm water. This can relieve a sore or scratchy throat temporarily.  Use saline nasal drops.  To help relieve nasal congestion, try saline nasal drops. You can buy these drops over the counter, and they can help relieve symptoms ? even in children.  Take over-the-counter cold and cough medications.  For adults and children older than 5, over-the-counter decongestants, antihistamines and pain relievers might offer some symptom relief. However, they won't prevent a cold or shorten its duration.

## 2020-11-18 NOTE — Assessment & Plan Note (Signed)
Well-controlled.  ACT score 22. Today's spirometry as normal. . Daily controller medication(s): Continue Singulair (montelukast) 10mg  daily at night. . May use albuterol rescue inhaler 2 puffs every 4 to 6 hours as needed for shortness of breath, chest tightness, coughing, and wheezing. May use albuterol rescue inhaler 2 puffs 5 to 15 minutes prior to strenuous physical activities. Monitor frequency of use.

## 2020-11-18 NOTE — Assessment & Plan Note (Signed)
2 days of symptoms with no fevers. Not taking anything OTC.  He most likely has a viral URI.  USE Flonase (fluticasone) nasal spray 1 spray per nostril twice a day for nasal congestion.   May use azelastine nasal spray 1-2 sprays per nostril twice a day as needed for runny nose/drainage.  Take mucinex twice a day with plenty of water to break up the mucous  Nasal saline spray (i.e., Simply Saline) or nasal saline lavage (i.e., NeilMed) is recommended as needed and prior to medicated nasal sprays.  If NOT better by the weekend then start Augmentin 875mg  twice a day for 10 days.

## 2020-11-20 ENCOUNTER — Ambulatory Visit: Payer: 59 | Admitting: Family Medicine

## 2020-12-02 ENCOUNTER — Other Ambulatory Visit: Payer: Self-pay | Admitting: Family Medicine

## 2020-12-30 ENCOUNTER — Other Ambulatory Visit: Payer: Self-pay | Admitting: Family Medicine

## 2021-02-13 ENCOUNTER — Other Ambulatory Visit: Payer: Self-pay | Admitting: Family Medicine

## 2021-02-18 ENCOUNTER — Other Ambulatory Visit: Payer: Self-pay | Admitting: Family Medicine

## 2021-02-23 ENCOUNTER — Ambulatory Visit: Payer: 59 | Admitting: Family Medicine

## 2021-03-03 ENCOUNTER — Ambulatory Visit: Payer: 59 | Admitting: Allergy and Immunology

## 2021-03-04 ENCOUNTER — Ambulatory Visit: Payer: 59 | Admitting: Allergy & Immunology

## 2021-03-15 ENCOUNTER — Ambulatory Visit: Payer: 59 | Admitting: Family Medicine

## 2021-03-15 ENCOUNTER — Encounter: Payer: Self-pay | Admitting: Family Medicine

## 2021-03-15 ENCOUNTER — Other Ambulatory Visit: Payer: Self-pay

## 2021-03-15 VITALS — BP 118/78 | HR 94 | Temp 98.1°F | Ht 75.0 in | Wt 295.5 lb

## 2021-03-15 DIAGNOSIS — Z0000 Encounter for general adult medical examination without abnormal findings: Secondary | ICD-10-CM | POA: Diagnosis not present

## 2021-03-15 DIAGNOSIS — F988 Other specified behavioral and emotional disorders with onset usually occurring in childhood and adolescence: Secondary | ICD-10-CM | POA: Diagnosis not present

## 2021-03-15 DIAGNOSIS — E669 Obesity, unspecified: Secondary | ICD-10-CM | POA: Insufficient documentation

## 2021-03-15 LAB — CBC
HCT: 43.3 % (ref 39.0–52.0)
Hemoglobin: 15 g/dL (ref 13.0–17.0)
MCHC: 34.7 g/dL (ref 30.0–36.0)
MCV: 91.8 fl (ref 78.0–100.0)
Platelets: 261 10*3/uL (ref 150.0–400.0)
RBC: 4.72 Mil/uL (ref 4.22–5.81)
RDW: 12.2 % (ref 11.5–15.5)
WBC: 9.5 10*3/uL (ref 4.0–10.5)

## 2021-03-15 LAB — COMPREHENSIVE METABOLIC PANEL
ALT: 26 U/L (ref 0–53)
AST: 14 U/L (ref 0–37)
Albumin: 5.1 g/dL (ref 3.5–5.2)
Alkaline Phosphatase: 52 U/L (ref 39–117)
BUN: 11 mg/dL (ref 6–23)
CO2: 28 mEq/L (ref 19–32)
Calcium: 10.1 mg/dL (ref 8.4–10.5)
Chloride: 102 mEq/L (ref 96–112)
Creatinine, Ser: 1 mg/dL (ref 0.40–1.50)
GFR: 107.44 mL/min (ref >60.00)
Glucose, Bld: 87 mg/dL (ref 70–99)
Potassium: 4.5 mEq/L (ref 3.5–5.1)
Sodium: 138 mEq/L (ref 135–145)
Total Bilirubin: 1 mg/dL (ref 0.2–1.2)
Total Protein: 7.2 g/dL (ref 6.0–8.3)

## 2021-03-15 LAB — LIPID PANEL
Cholesterol: 150 mg/dL (ref 0–200)
HDL: 32.6 mg/dL — ABNORMAL LOW (ref >39.00)
LDL Cholesterol: 79 mg/dL (ref 0–99)
NonHDL: 117.37
Total CHOL/HDL Ratio: 5
Triglycerides: 192 mg/dL — ABNORMAL HIGH (ref 0.0–149.0)
VLDL: 38.4 mg/dL (ref 0.0–40.0)

## 2021-03-15 MED ORDER — LISDEXAMFETAMINE DIMESYLATE 40 MG PO CAPS: 40.0000 mg | ORAL_CAPSULE | ORAL | 0 refills | Status: DC

## 2021-03-15 NOTE — Progress Notes (Signed)
Chief Complaint  Patient presents with   Annual Exam    Well Male Marvin Gates is here for a complete physical.   His last physical was >1 year ago.  Current diet: in general, diet could be better.    Current exercise: lifting Weight trend: stable Fatigue out of ordinary? No. Seat belt? Yes.    Health maintenance Tetanus- Yes HIV- Yes Hep C- Yes  ADHD Stopped taking Adderall as it did not work at the 20 mg dosage. We had held off on higher dosages due to elevated BP.  He did not have any adverse effects.  He starts school again in about a month.  Past Medical History:  Diagnosis Date   Allergic rhinitis    Asthma    Food allergy    High blood pressure      Past Surgical History:  Procedure Laterality Date   TONSILLECTOMY      Medications  Current Outpatient Medications on File Prior to Visit  Medication Sig Dispense Refill   albuterol (PROAIR HFA) 108 (90 Base) MCG/ACT inhaler Inhale 2 puffs into the lungs every 4 (four) hours as needed for wheezing or shortness of breath. 18 g 5   amLODipine (NORVASC) 5 MG tablet TAKE 1 TABLET(5 MG) BY MOUTH DAILY 90 tablet 2   cetirizine (ZYRTEC) 10 MG tablet Take 1 tablet (10 mg total) by mouth daily as needed for allergies. 31 tablet 5   EPINEPHrine (EPIPEN 2-PAK) 0.3 mg/0.3 mL IJ SOAJ injection Use as directed for severe allergic reaction 2 each 2   FLOVENT HFA 110 MCG/ACT inhaler FOR ASTHMA FLARES TAKE 2 PUFFS TWICE A DAY WITH A SPACER FOR 2 WEEKS OR UNTIL COUGH AND WHEEZE FREE 12 g 5   montelukast (SINGULAIR) 10 MG tablet Take 1 tablet (10 mg total) by mouth at bedtime. 90 tablet 3   rosuvastatin (CRESTOR) 10 MG tablet TAKE 1 TABLET(10 MG) BY MOUTH DAILY 30 tablet 0   amphetamine-dextroamphetamine (ADDERALL XR) 20 MG 24 hr capsule Take 1 capsule (20 mg total) by mouth daily. (Patient not taking: Reported on 03/15/2021) 30 capsule 0   azelastine (ASTELIN) 0.1 % nasal spray Place 1-2 sprays into both nostrils 2 (two) times daily as  needed (nasal drainage). Use in each nostril as directed (Patient not taking: Reported on 03/15/2021) 30 mL 5   buPROPion (WELLBUTRIN XL) 300 MG 24 hr tablet Take 1 tablet (300 mg total) by mouth daily. 90 tablet 1   Allergies Allergies  Allergen Reactions   Banana Itching    Mouth itches   Wasp Venom Swelling    Family History Family History  Problem Relation Age of Onset   Asthma Mother    Allergic rhinitis Mother    Food Allergy Mother        shellfish allergy   Diabetes Mother    High blood pressure Mother    Diabetes Father    High blood pressure Father    Angioedema Neg Hx    Eczema Neg Hx    Immunodeficiency Neg Hx    Urticaria Neg Hx     Review of Systems: Constitutional: no fevers or chills Eye:  no recent significant change in vision Ear/Nose/Mouth/Throat:  Ears:  no hearing loss Nose/Mouth/Throat:  no complaints of nasal congestion, no sore throat Cardiovascular:  no chest pain Respiratory:  no shortness of breath Gastrointestinal:  no abdominal pain, no change in bowel habits GU:  Male: negative for dysuria Musculoskeletal/Extremities:  no pain of the joints  Integumentary (Skin/Breast):  no abnormal skin lesions reported Neurologic:  no headaches Endocrine: No unexpected weight changes Hematologic/Lymphatic:  no night sweats  Exam BP 118/78   Pulse 94   Temp 98.1 F (36.7 C) (Oral)   Ht 6\' 3"  (1.905 m)   Wt 295 lb 8 oz (134 kg)   SpO2 99%   BMI 36.93 kg/m  General:  well developed, well nourished, in no apparent distress Skin:  no significant moles, warts, or growths Head:  no masses, lesions, or tenderness Eyes:  pupils equal and round, sclera anicteric without injection Ears:  canals without lesions, TMs shiny without retraction, no obvious effusion, no erythema Nose:  nares patent, septum midline, mucosa normal Throat/Pharynx:  lips and gingiva without lesion; tongue and uvula midline; non-inflamed pharynx; no exudates or postnasal  drainage Neck: neck supple without adenopathy, thyromegaly, or masses Lungs:  clear to auscultation, breath sounds equal bilaterally, no respiratory distress Cardio:  regular rate and rhythm, no bruits, no LE edema Abdomen:  abdomen soft, nontender; bowel sounds normal; no masses or organomegaly Genital (male): Deferred Rectal: Deferred Musculoskeletal:  symmetrical muscle groups noted without atrophy or deformity Extremities:  no clubbing, cyanosis, or edema, no deformities, no skin discoloration Neuro:  gait normal; deep tendon reflexes normal and symmetric Psych: well oriented with normal range of affect and appropriate judgment/insight  Assessment and Plan  Well adult exam - Plan: CBC, Comprehensive metabolic panel, Lipid panel  Obesity (BMI 30-39.9)   Well 20 y.o. male. Counseled on diet and exercise. Self testicular exams recommended at least monthly.  Other orders as above. ADD: Chronic, unstable.  Start Vyvanse 40 mg daily.  Follow-up in 2 months as he starts school next month. We will refer him to the medical weight loss team for his weight. The patient voiced understanding and agreement to the plan.  [address] [place], DO 03/15/21 1:39 PM

## 2021-03-15 NOTE — Patient Instructions (Addendum)
Give [place] 2-3 business days to get the results of your labs back.   If you do not hear anything about your referral in the next 1-2 weeks, call our office and ask for an update.  Keep the diet clean and stay active.  Do monthly self testicular checks in the shower. You are feeling for lumps/bumps that don't belong. If you feel anything like this, let me know!  OK to use Debrox (peroxide) in the ear to loosen up wax. Also recommend using a bulb syringe (for removing boogers from baby's noses) to flush through warm water and vinegar (3-4:1 ratio). An alternative, though more expensive, is an elephant ear washer wax removal kit. Do not use Q-tips as this can impact wax further.  Consider getting the covid booster shot.   Let us know if you need anything.

## 2021-03-19 ENCOUNTER — Other Ambulatory Visit: Payer: Self-pay | Admitting: Family Medicine

## 2021-04-02 ENCOUNTER — Ambulatory Visit: Payer: 59 | Admitting: Family

## 2021-04-19 ENCOUNTER — Other Ambulatory Visit: Payer: Self-pay | Admitting: Family Medicine

## 2021-04-20 ENCOUNTER — Other Ambulatory Visit: Payer: Self-pay | Admitting: Family Medicine

## 2021-04-25 ENCOUNTER — Other Ambulatory Visit: Payer: Self-pay | Admitting: Family Medicine

## 2021-05-18 ENCOUNTER — Ambulatory Visit: Payer: 59 | Admitting: Family Medicine

## 2021-05-21 ENCOUNTER — Other Ambulatory Visit: Payer: Self-pay | Admitting: Family Medicine

## 2021-05-21 DIAGNOSIS — F988 Other specified behavioral and emotional disorders with onset usually occurring in childhood and adolescence: Secondary | ICD-10-CM

## 2021-05-21 MED ORDER — LISDEXAMFETAMINE DIMESYLATE 40 MG PO CAPS: 40.0000 mg | ORAL_CAPSULE | ORAL | 0 refills | Status: DC

## 2021-06-09 ENCOUNTER — Ambulatory Visit: Payer: 59 | Admitting: Family Medicine

## 2021-06-17 ENCOUNTER — Ambulatory Visit: Payer: 59 | Admitting: Family Medicine

## 2021-07-20 ENCOUNTER — Encounter: Payer: Self-pay | Admitting: Family Medicine

## 2021-07-20 ENCOUNTER — Telehealth (INDEPENDENT_AMBULATORY_CARE_PROVIDER_SITE_OTHER): Payer: 59 | Admitting: Family Medicine

## 2021-07-20 ENCOUNTER — Other Ambulatory Visit: Payer: Self-pay

## 2021-07-20 DIAGNOSIS — H1033 Unspecified acute conjunctivitis, bilateral: Secondary | ICD-10-CM | POA: Diagnosis not present

## 2021-07-20 MED ORDER — POLYMYXIN B-TRIMETHOPRIM 10000-0.1 UNIT/ML-% OP SOLN: 2.0000 [drp] | Freq: Four times a day (QID) | OPHTHALMIC | 0 refills | Status: DC

## 2021-07-20 NOTE — Progress Notes (Signed)
Chief Complaint  Patient presents with   Eye Problem    Marvin Gates is here for bilateral eye irritation. Due to COVID-19 pandemic, we are interacting via web portal for an electronic face-to-face visit. I verified patient's ID using 2 identifiers. Patient agreed to proceed with visit via this method. Patient is at home, I am at office. Patient and I are present for visit.   Duration: 2 weeks Chemical exposure? No  Recent URI? Yes  Contact lenses? No  History of allergies? Yes  Treatment to date: [place] drops  Past Medical History:  Diagnosis Date   Allergic rhinitis    Asthma    Food allergy    High blood pressure    Family History  Problem Relation Age of Onset   Asthma Mother    Allergic rhinitis Mother    Food Allergy Mother        shellfish allergy   Diabetes Mother    High blood pressure Mother    Diabetes Father    High blood pressure Father    Angioedema Neg Hx    Eczema Neg Hx    Immunodeficiency Neg Hx    Urticaria Neg Hx    Exam No conversational dyspnea Age appropriate judgment and insight Nml affect and mood  Acute conjunctivitis of both eyes, unspecified acute conjunctivitis type - Plan: trimethoprim-polymyxin b (POLYTRIM) ophthalmic solution  Drops as above as contingency. Instructed to practice good hand hygiene and try not to touch face. Warm compresses and artificial tears also recommended. F/u if no improvement in 7-10 days. Pt voiced understanding and agreement to the plan.  Jilda Roche [place], DO 07/20/21 1:08 PM

## 2021-07-27 ENCOUNTER — Other Ambulatory Visit: Payer: Self-pay | Admitting: Family Medicine

## 2021-07-28 MED ORDER — ROSUVASTATIN CALCIUM 10 MG PO TABS: 10.0000 mg | ORAL_TABLET | Freq: Every day | ORAL | 0 refills | Status: DC

## 2021-08-26 ENCOUNTER — Other Ambulatory Visit: Payer: Self-pay | Admitting: Family Medicine

## 2021-09-07 ENCOUNTER — Other Ambulatory Visit: Payer: Self-pay | Admitting: Family Medicine

## 2021-09-08 MED ORDER — ROSUVASTATIN CALCIUM 10 MG PO TABS: 10.0000 mg | ORAL_TABLET | Freq: Every day | ORAL | 0 refills | Status: DC

## 2021-09-20 ENCOUNTER — Encounter: Payer: Self-pay | Admitting: Family Medicine

## 2021-09-20 ENCOUNTER — Ambulatory Visit (INDEPENDENT_AMBULATORY_CARE_PROVIDER_SITE_OTHER): Payer: 59 | Admitting: Family Medicine

## 2021-09-20 VITALS — BP 118/73 | HR 84 | Temp 98.5°F | Ht 75.0 in | Wt 279.4 lb

## 2021-09-20 DIAGNOSIS — Z0000 Encounter for general adult medical examination without abnormal findings: Secondary | ICD-10-CM

## 2021-09-20 DIAGNOSIS — Z79899 Other long term (current) drug therapy: Secondary | ICD-10-CM

## 2021-09-20 LAB — COMPREHENSIVE METABOLIC PANEL
ALT: 15 U/L (ref 0–53)
AST: 12 U/L (ref 0–37)
Albumin: 4.9 g/dL (ref 3.5–5.2)
Alkaline Phosphatase: 46 U/L (ref 39–117)
BUN: 12 mg/dL (ref 6–23)
CO2: 30 mEq/L (ref 19–32)
Calcium: 10 mg/dL (ref 8.4–10.5)
Chloride: 102 mEq/L (ref 96–112)
Creatinine, Ser: 1.04 mg/dL (ref 0.40–1.50)
GFR: 102.13 mL/min (ref >60.00)
Glucose, Bld: 83 mg/dL (ref 70–99)
Potassium: 4.5 mEq/L (ref 3.5–5.1)
Sodium: 140 mEq/L (ref 135–145)
Total Bilirubin: 0.9 mg/dL (ref 0.2–1.2)
Total Protein: 7.1 g/dL (ref 6.0–8.3)

## 2021-09-20 LAB — CBC
HCT: 42.3 % (ref 39.0–52.0)
Hemoglobin: 14.5 g/dL (ref 13.0–17.0)
MCHC: 34.2 g/dL (ref 30.0–36.0)
MCV: 92.3 fl (ref 78.0–100.0)
Platelets: 211 10*3/uL (ref 150.0–400.0)
RBC: 4.58 Mil/uL (ref 4.22–5.81)
RDW: 12.9 % (ref 11.5–15.5)
WBC: 7.5 10*3/uL (ref 4.0–10.5)

## 2021-09-20 LAB — LIPID PANEL
Cholesterol: 199 mg/dL (ref 0–200)
HDL: 33.6 mg/dL — ABNORMAL LOW (ref >39.00)
LDL Cholesterol: 137 mg/dL — ABNORMAL HIGH (ref 0–99)
NonHDL: 165.13
Total CHOL/HDL Ratio: 6
Triglycerides: 142 mg/dL (ref 0.0–149.0)
VLDL: 28.4 mg/dL (ref 0.0–40.0)

## 2021-09-20 NOTE — Progress Notes (Signed)
Chief Complaint  Patient presents with   Annual Exam    Well Male Marvin Gates is here for a complete physical.   His last physical was last calendar year.  Current diet: in general, a "healthy" diet.   Current exercise: some lifting Weight trend: lost 20 lbs Fatigue out of ordinary? No. Seat belt? Yes.   Advanced directive? No  Health maintenance Tetanus- Yes HIV- Yes Hep C- Yes  Past Medical History:  Diagnosis Date   Allergic rhinitis    Asthma    Food allergy    High blood pressure      Past Surgical History:  Procedure Laterality Date   TONSILLECTOMY      Medications  Current Outpatient Medications on File Prior to Visit  Medication Sig Dispense Refill   albuterol (PROAIR HFA) 108 (90 Base) MCG/ACT inhaler Inhale 2 puffs into the lungs every 4 (four) hours as needed for wheezing or shortness of breath. 18 g 5   amLODipine (NORVASC) 5 MG tablet TAKE 1 TABLET(5 MG) BY MOUTH DAILY 90 tablet 2   buPROPion (WELLBUTRIN XL) 300 MG 24 hr tablet TAKE 1 TABLET(300 MG) BY MOUTH DAILY 90 tablet 1   cetirizine (ZYRTEC) 10 MG tablet Take 1 tablet (10 mg total) by mouth daily as needed for allergies. 31 tablet 5   EPINEPHrine (EPIPEN 2-PAK) 0.3 mg/0.3 mL IJ SOAJ injection Use as directed for severe allergic reaction 2 each 2   FLOVENT HFA 110 MCG/ACT inhaler FOR ASTHMA FLARES TAKE 2 PUFFS TWICE A DAY WITH A SPACER FOR 2 WEEKS OR UNTIL COUGH AND WHEEZE FREE 12 g 5   lisdexamfetamine (VYVANSE) 40 MG capsule Take 1 capsule (40 mg total) by mouth every morning. 30 capsule 0   lisdexamfetamine (VYVANSE) 40 MG capsule Take 1 capsule (40 mg total) by mouth every morning. 30 capsule 0   lisdexamfetamine (VYVANSE) 40 MG capsule Take 1 capsule (40 mg total) by mouth every morning. 30 capsule 0   montelukast (SINGULAIR) 10 MG tablet Take 1 tablet (10 mg total) by mouth at bedtime. 90 tablet 3   rosuvastatin (CRESTOR) 10 MG tablet Take 1 tablet (10 mg total) by mouth daily. 90 tablet 0     Allergies Allergies  Allergen Reactions   Banana Itching    Mouth itches   Wasp Venom Swelling    Family History Family History  Problem Relation Age of Onset   Asthma Mother    Allergic rhinitis Mother    Food Allergy Mother        shellfish allergy   Diabetes Mother    High blood pressure Mother    Diabetes Father    High blood pressure Father    Angioedema Neg Hx    Eczema Neg Hx    Immunodeficiency Neg Hx    Urticaria Neg Hx     Review of Systems: Constitutional: no fevers or chills Eye:  no recent significant change in vision Ear/Nose/Mouth/Throat:  Ears:  no hearing loss Nose/Mouth/Throat:  no complaints of nasal congestion, no sore throat Cardiovascular:  no chest pain Respiratory:  no shortness of breath Gastrointestinal:  no abdominal pain, no change in bowel habits GU:  Male: negative for dysuria Musculoskeletal/Extremities:  no pain of the joints Integumentary (Skin/Breast):  no abnormal skin lesions reported Neurologic:  no headaches Endocrine: No unexpected weight changes Hematologic/Lymphatic:  no night sweats  Exam BP 118/73    Pulse 84    Temp 98.5 F (36.9 C) (Oral)  Ht 6\' 3"  (1.905 m)    Wt 279 lb 6 oz (126.7 kg)    SpO2 97%    BMI 34.92 kg/m  General:  well developed, well nourished, in no apparent distress Skin:  no significant moles, warts, or growths Head:  no masses, lesions, or tenderness Eyes:  pupils equal and round, sclera anicteric without injection Ears:  canals without lesions, TMs shiny without retraction, no obvious effusion, no erythema Nose:  nares patent, septum midline, mucosa normal Throat/Pharynx:  lips and gingiva without lesion; tongue and uvula midline; non-inflamed pharynx; no exudates or postnasal drainage Neck: neck supple without adenopathy, thyromegaly, or masses Lungs:  clear to auscultation, breath sounds equal bilaterally, no respiratory distress Cardio:  regular rate and rhythm, no bruits, no LE  edema Abdomen:  abdomen soft, nontender; bowel sounds normal; no masses or organomegaly Genital (male): Deferred Rectal: Deferred Musculoskeletal:  symmetrical muscle groups noted without atrophy or deformity Extremities:  no clubbing, cyanosis, or edema, no deformities, no skin discoloration Neuro:  gait normal; deep tendon reflexes normal and symmetric Psych: well oriented with normal range of affect and appropriate judgment/insight  Assessment and Plan  Well adult exam - Plan: CBC, Comprehensive metabolic panel, Lipid panel  Encounter for long-term (current) use of high-risk medication - Plan: Drug Monitoring Panel [phone] , Urine   Well 21 y.o. male. Counseled on diet and exercise. Counseling contact info provided given recent passing of his father.  CC in dad, requesting Cologard? Discussed this isn't ideal, offered to refer to GI for their opinion but he wouldn't need screening until 23, 10 years before the age of his dad's dx. He will discuss with his mother and let me know.  Advanced directive form provided today.  Bivalent COVID vaccination booster recommended.  Politely declined flu shot.  CSC and UDS updated.  Self testicular exams recommended at least monthly.  Other orders as above. Follow up in 6 mo pending the above workup. The patient voiced understanding and agreement to the plan.  [place], DO 09/20/21 1:05 PM

## 2021-09-20 NOTE — Patient Instructions (Addendum)
Give [place] 2-3 business days to get the results of your labs back.   Keep the diet clean and stay active.  Do monthly self testicular checks in the shower. You are feeling for lumps/bumps that don't belong. If you feel anything like this, let me know!  Send me a message if you would like me to refer you to the gastroenterology team for their opinion. I would not do the Cologard.   I recommend getting the updated bivalent covid vaccination booster at your convenience.   Let us know if you need anything.  Please consider counseling. Contact [phone] to schedule an appointment or inquire about cost/insurance coverage.  [facility] located at [address], [address], [place], [place].  Phone number = [phone].  Dr. Regan Lemming - Adult Psychiatry.    [facility] located at [address] [place], [place], [place]. Phone number = [phone].   [facility] located at [address] [place], [place].  Phone number = [phone].   [facility] located at [address] [place], [place], [place].  Phone number = [phone].

## 2021-09-22 LAB — DRUG MONITORING PANEL 376104, URINE
Amphetamines: NEGATIVE ng/mL (ref <500)
Barbiturates: NEGATIVE ng/mL (ref <300)
Benzodiazepines: NEGATIVE ng/mL (ref <100)
Cocaine Metabolite: NEGATIVE ng/mL (ref <150)
Desmethyltramadol: NEGATIVE ng/mL (ref <100)
Opiates: NEGATIVE ng/mL (ref <100)
Oxycodone: NEGATIVE ng/mL (ref <100)
Tramadol: NEGATIVE ng/mL (ref <100)

## 2021-09-22 LAB — DM TEMPLATE

## 2021-10-22 ENCOUNTER — Other Ambulatory Visit: Payer: Self-pay | Admitting: Family Medicine

## 2022-01-10 ENCOUNTER — Encounter: Payer: Self-pay | Admitting: Family Medicine

## 2022-01-11 ENCOUNTER — Telehealth (INDEPENDENT_AMBULATORY_CARE_PROVIDER_SITE_OTHER): Payer: 59 | Admitting: Family Medicine

## 2022-01-11 ENCOUNTER — Encounter: Payer: Self-pay | Admitting: Family Medicine

## 2022-01-11 DIAGNOSIS — J0100 Acute maxillary sinusitis, unspecified: Secondary | ICD-10-CM

## 2022-01-11 MED ORDER — PREDNISONE 20 MG PO TABS: 40.0000 mg | ORAL_TABLET | Freq: Every day | ORAL | 0 refills | Status: AC

## 2022-01-11 NOTE — Progress Notes (Signed)
Chief Complaint  ?Patient presents with  ? Sinusitis  ? ? ?Marvin Gates here for URI complaints. Due to COVID-19 pandemic, we are interacting via web portal for an electronic face-to-face visit. I verified patient's ID using 2 identifiers. Patient agreed to proceed with visit via this method. Patient is at home, I am at office. Patient and I are present for visit.  ? ?Duration: 1 day  ?Associated symptoms: sinus congestion, sinus pain, rhinorrhea, and itchy watery eyes ?Denies: ear pain, ear drainage, sore throat, wheezing, shortness of breath, myalgia, and fevers ?Treatment to date: Bendaryl, Zyrtec ?Sick contacts: No ? ?Past Medical History:  ?Diagnosis Date  ? Allergic rhinitis   ? Asthma   ? Food allergy   ? High blood pressure   ? ? ?Objective ?No conversational dyspnea ?Age appropriate judgment and insight ?Nml affect and mood ? ?Acute maxillary sinusitis, recurrence not specified - Plan: predniSONE (DELTASONE) 20 MG tablet ? ?Might be getting better. 5 d pred burst as contingency if he does not cont to improve. Cont Benadryl and Zyrtec .Continue to push fluids, practice good hand hygiene. ?F/u prn. If starting to experience fevers, shaking, or shortness of breath, seek immediate care. ?Pt voiced understanding and agreement to the plan. ? ?Sharlene Dory, DO ?01/11/22 ?10:59 AM ? ?

## 2022-01-12 ENCOUNTER — Telehealth: Payer: 59 | Admitting: Family Medicine

## 2022-01-24 ENCOUNTER — Other Ambulatory Visit: Payer: Self-pay | Admitting: Allergy

## 2022-03-15 ENCOUNTER — Encounter: Payer: Self-pay | Admitting: Family Medicine

## 2022-03-16 ENCOUNTER — Other Ambulatory Visit: Payer: Self-pay | Admitting: Family Medicine

## 2022-03-16 DIAGNOSIS — F988 Other specified behavioral and emotional disorders with onset usually occurring in childhood and adolescence: Secondary | ICD-10-CM

## 2022-03-16 MED ORDER — MONTELUKAST SODIUM 10 MG PO TABS: ORAL_TABLET | ORAL | 2 refills | Status: DC

## 2022-03-16 MED ORDER — LISDEXAMFETAMINE DIMESYLATE 40 MG PO CAPS: 40.0000 mg | ORAL_CAPSULE | ORAL | 0 refills | Status: DC

## 2022-03-21 ENCOUNTER — Ambulatory Visit: Payer: 59 | Admitting: Family Medicine

## 2022-04-26 ENCOUNTER — Ambulatory Visit: Payer: 59 | Admitting: Family Medicine

## 2022-05-07 ENCOUNTER — Other Ambulatory Visit: Payer: Self-pay | Admitting: Family Medicine

## 2022-05-09 ENCOUNTER — Encounter: Payer: Self-pay | Admitting: Family Medicine

## 2022-05-20 ENCOUNTER — Encounter: Payer: Self-pay | Admitting: Family Medicine

## 2022-05-22 ENCOUNTER — Encounter: Payer: Self-pay | Admitting: Family Medicine

## 2022-05-23 ENCOUNTER — Other Ambulatory Visit: Payer: Self-pay | Admitting: Family Medicine

## 2022-05-23 MED ORDER — AMLODIPINE BESYLATE 5 MG PO TABS: ORAL_TABLET | ORAL | 2 refills | Status: DC

## 2022-07-17 ENCOUNTER — Encounter: Payer: Self-pay | Admitting: Family Medicine

## 2022-07-18 ENCOUNTER — Other Ambulatory Visit: Payer: Self-pay | Admitting: Family Medicine

## 2022-07-18 DIAGNOSIS — F988 Other specified behavioral and emotional disorders with onset usually occurring in childhood and adolescence: Secondary | ICD-10-CM

## 2022-07-18 MED ORDER — LISDEXAMFETAMINE DIMESYLATE 40 MG PO CAPS: 40.0000 mg | ORAL_CAPSULE | ORAL | 0 refills | Status: DC

## 2022-08-15 ENCOUNTER — Other Ambulatory Visit: Payer: Self-pay | Admitting: Family Medicine

## 2022-08-15 ENCOUNTER — Encounter: Payer: Self-pay | Admitting: Family Medicine

## 2022-08-15 ENCOUNTER — Ambulatory Visit: Payer: 59 | Admitting: Family Medicine

## 2022-08-15 VITALS — BP 132/85 | HR 95 | Temp 98.1°F | Ht 75.0 in | Wt 255.0 lb

## 2022-08-15 DIAGNOSIS — F988 Other specified behavioral and emotional disorders with onset usually occurring in childhood and adolescence: Secondary | ICD-10-CM

## 2022-08-15 DIAGNOSIS — E7800 Pure hypercholesterolemia, unspecified: Secondary | ICD-10-CM | POA: Diagnosis not present

## 2022-08-15 DIAGNOSIS — I10 Essential (primary) hypertension: Secondary | ICD-10-CM | POA: Diagnosis not present

## 2022-08-15 LAB — COMPREHENSIVE METABOLIC PANEL
ALT: 13 U/L (ref 0–53)
AST: 10 U/L (ref 0–37)
Albumin: 5.3 g/dL — ABNORMAL HIGH (ref 3.5–5.2)
Alkaline Phosphatase: 46 U/L (ref 39–117)
BUN: 13 mg/dL (ref 6–23)
CO2: 30 mEq/L (ref 19–32)
Calcium: 10.2 mg/dL (ref 8.4–10.5)
Chloride: 101 mEq/L (ref 96–112)
Creatinine, Ser: 1.1 mg/dL (ref 0.40–1.50)
GFR: 94.88 mL/min (ref >60.00)
Glucose, Bld: 83 mg/dL (ref 70–99)
Potassium: 4.3 mEq/L (ref 3.5–5.1)
Sodium: 140 mEq/L (ref 135–145)
Total Bilirubin: 0.9 mg/dL (ref 0.2–1.2)
Total Protein: 7.5 g/dL (ref 6.0–8.3)

## 2022-08-15 LAB — LIPID PANEL
Cholesterol: 219 mg/dL — ABNORMAL HIGH (ref 0–200)
HDL: 35.2 mg/dL — ABNORMAL LOW (ref >39.00)
LDL Cholesterol: 154 mg/dL — ABNORMAL HIGH (ref 0–99)
NonHDL: 183.3
Total CHOL/HDL Ratio: 6
Triglycerides: 149 mg/dL (ref 0.0–149.0)
VLDL: 29.8 mg/dL (ref 0.0–40.0)

## 2022-08-15 MED ORDER — ROSUVASTATIN CALCIUM 10 MG PO TABS: 10.0000 mg | ORAL_TABLET | Freq: Every day | ORAL | 0 refills | Status: DC

## 2022-08-15 NOTE — Progress Notes (Signed)
Chief Complaint  Patient presents with   Follow-up    Medication refills     Marvin Gates is 21 y.o. male here for ADHD follow up.  Patient is currently on Vyvanse 40 mg/d and compliance is excellent. Symptoms are well controlled overall.  Side effects include: none. Patient believes their dose should be not significantly changed. Denies tics, weight loss, difficulties with sleep, self-medication, alcohol/drug abuse, chest pain, or palpitations.  Hypertension Patient presents for hypertension follow up. He does not monitor home blood pressures. He is compliant with medication- Norvasc 5 mg/d. Patient has these side effects of medication: none He is adhering to a healthy diet overall. Has lost nearly 50 lbs.  Exercise: has not lifted No Cp or SOB.   Hyperlipidemia Patient presents for hyperlipidemia follow up. Currently being treated with Crestor 20 mg/d and compliance with treatment thus far has been good. He denies myalgias. Diet/exercise as above.  The patient is not known to have coexisting coronary artery disease.  Past Medical History:  Diagnosis Date   Allergic rhinitis    Asthma    Food allergy    High blood pressure     BP 132/85 (BP Location: Left Arm, Patient Position: Sitting, Cuff Size: Normal)   Pulse 95   Temp 98.1 F (36.7 C) (Oral)   Ht 6\' 3"  (1.905 m)   Wt 255 lb (115.7 kg)   SpO2 99%   BMI 31.87 kg/m  Gen- awake, alert, appearing stated age Heart- RRR Lungs- CTAB, no accessory muscle use Neuro- no facial tics Psych- age appropriate judgment and insight, normal mood and affect  Attention deficit disorder (ADD) without hyperactivity  Essential hypertension  Pure hypercholesterolemia - Plan: Lipid panel, Comprehensive metabolic panel  Chronic, stable.  Continue Vyvanse 40 mg daily. Chronic, stable.  Continue Norvasc 5 mg daily.  Consider getting a home monitor as he is losing weight. Chronic, stable.  Continue Crestor 20 mg daily. F/u in  6 mo for CPE. Pt voiced understanding and agreement to the plan.  [place], DO 08/15/22 11:39 AM

## 2022-08-15 NOTE — Patient Instructions (Signed)
Give us 2-3 business days to get the results of your labs back.   Keep the diet clean and stay active.  Let us know if you need anything. 

## 2022-08-23 ENCOUNTER — Other Ambulatory Visit: Payer: Self-pay | Admitting: Family Medicine

## 2022-08-23 ENCOUNTER — Encounter: Payer: Self-pay | Admitting: Family Medicine

## 2022-08-23 ENCOUNTER — Other Ambulatory Visit: Payer: Self-pay

## 2022-08-23 DIAGNOSIS — F988 Other specified behavioral and emotional disorders with onset usually occurring in childhood and adolescence: Secondary | ICD-10-CM

## 2022-08-23 MED ORDER — LISDEXAMFETAMINE DIMESYLATE 40 MG PO CAPS: 40.0000 mg | ORAL_CAPSULE | ORAL | 0 refills | Status: DC

## 2022-08-23 MED ORDER — MONTELUKAST SODIUM 10 MG PO TABS: ORAL_TABLET | ORAL | 2 refills | Status: DC

## 2022-08-23 MED ORDER — AMLODIPINE BESYLATE 5 MG PO TABS: ORAL_TABLET | ORAL | 2 refills | Status: DC

## 2022-08-23 MED ORDER — ROSUVASTATIN CALCIUM 10 MG PO TABS: 10.0000 mg | ORAL_TABLET | Freq: Every day | ORAL | 3 refills | Status: DC

## 2022-08-23 MED ORDER — BUPROPION HCL ER (XL) 300 MG PO TB24: ORAL_TABLET | ORAL | 3 refills | Status: DC

## 2022-08-25 ENCOUNTER — Encounter: Payer: Self-pay | Admitting: Family Medicine

## 2022-08-26 ENCOUNTER — Encounter: Payer: Self-pay | Admitting: Family Medicine

## 2022-08-26 ENCOUNTER — Ambulatory Visit: Payer: 59 | Admitting: Family Medicine

## 2022-08-26 ENCOUNTER — Telehealth (INDEPENDENT_AMBULATORY_CARE_PROVIDER_SITE_OTHER): Payer: 59 | Admitting: Family Medicine

## 2022-08-26 DIAGNOSIS — S30811A Abrasion of abdominal wall, initial encounter: Secondary | ICD-10-CM

## 2022-08-26 DIAGNOSIS — U071 COVID-19: Secondary | ICD-10-CM | POA: Diagnosis not present

## 2022-08-26 MED ORDER — ALBUTEROL SULFATE HFA 108 (90 BASE) MCG/ACT IN AERS: 2.0000 | INHALATION_SPRAY | RESPIRATORY_TRACT | 5 refills | Status: DC | PRN

## 2022-08-26 NOTE — Progress Notes (Signed)
Virtual Video Visit via MyChart Note  I connected with  Marvin Gates on 08/26/22 at 10:40 AM EST by the video enabled telemedicine application for MyChart, and verified that I am speaking with the correct person using two identifiers.   I introduced myself as a Publishing rights manager with the practice. We discussed the limitations of evaluation and management by telemedicine and the availability of in person appointments. The patient expressed understanding and agreed to proceed.  Participating parties in this visit include: The patient and the nurse practitioner listed.  The patient is: At home I am: In the office - [facility] at [facility]  Subjective:    CC:  Chief Complaint  Patient presents with   Covid Positive    Last night tested positive    HPI: Marvin Gates is a 21 y.o. year old male presenting today via MyChart today for COVID.  Patient reports he started feeling poorly on Wednesday, but thought it was just a cold. The next day he felt worse (fever 103 last night) and today he tested positive for COVID. Reports symptoms aren't too bad today. Current symptoms include headache, nasal congestion, sore throat, fatigue, productive cough with yellow sputum. He denies any chest pain, dyspnea, wheezing, GI/GU symptoms. Reports childhood asthma, but no problems since middle school - cannot even remember the last night he had to use albuterol.    Additionally he reports he "scratched my belly button on Christmas" and yesterday he noticed it felt "a little weird and might be smelling." States he started putting triple antibiotic ointment on it and it is improving, but he wanted to make [place] aware. He is unable to show it on video visit today, but denies any erythema, warmth, inflammation, pain. States umbilicus appears normal, other than the scratch.    Past medical history, Surgical history, Family history not pertinant except as noted below, Social history, Allergies, and  medications have been entered into the medical record, reviewed, and corrections made.   Review of Systems:  All review of systems negative except what is listed in the HPI   Objective:    General:  Speaking clearly in complete sentences. Absent shortness of breath noted.   Alert and oriented x3.   Normal judgment.  Absent acute distress.   Impression and Recommendations:    1. COVID-19 Doing well currently. No severe symptoms.  History of childhood asthma - refilling albuterol for PRN use Continue supportive measures including rest, hydration, humidifier use, steam showers, warm compresses to sinuses, warm liquids with lemon and honey, and over-the-counter cough, cold, and analgesics as needed.  Information added to AVS Patient aware of signs/symptoms requiring further/urgent evaluation.   - albuterol (PROAIR HFA) 108 (90 Base) MCG/ACT inhaler; Inhale 2 puffs into the lungs every 4 (four) hours as needed for wheezing or shortness of breath.  Dispense: 18 g; Refill: 5    2. Abrasion of abdominal wall, initial encounter Reports abrasion to umbilicus a few days ago. Denies any signs/symptoms of cellulitis. Continue with topical antibiotic ointment.  Patient aware of signs/symptoms requiring further/urgent evaluation.    Follow-up if symptoms worsen or fail to improve.    I discussed the assessment and treatment plan with the patient. The patient was provided an opportunity to ask questions and all were answered. The patient agreed with the plan and demonstrated an understanding of the instructions.   The patient was advised to call back or seek an in-person evaluation if the symptoms worsen or  if the condition fails to improve as anticipated.   Terrilyn Saver, NP

## 2022-08-26 NOTE — Patient Instructions (Signed)

## 2022-09-05 ENCOUNTER — Encounter: Payer: Self-pay | Admitting: Family Medicine

## 2022-09-05 ENCOUNTER — Other Ambulatory Visit: Payer: Self-pay | Admitting: Family Medicine

## 2022-09-05 MED ORDER — LISDEXAMFETAMINE DIMESYLATE 40 MG PO CAPS: 40.0000 mg | ORAL_CAPSULE | ORAL | 0 refills | Status: DC

## 2022-09-06 ENCOUNTER — Other Ambulatory Visit: Payer: Self-pay | Admitting: Family Medicine

## 2022-09-13 ENCOUNTER — Telehealth (INDEPENDENT_AMBULATORY_CARE_PROVIDER_SITE_OTHER): Payer: 59 | Admitting: Family Medicine

## 2022-09-13 ENCOUNTER — Encounter: Payer: Self-pay | Admitting: Family Medicine

## 2022-09-13 DIAGNOSIS — F988 Other specified behavioral and emotional disorders with onset usually occurring in childhood and adolescence: Secondary | ICD-10-CM | POA: Diagnosis not present

## 2022-09-13 MED ORDER — METHYLPHENIDATE HCL ER 36 MG PO TB24: 36.0000 mg | ORAL_TABLET | Freq: Every day | ORAL | 0 refills | Status: DC

## 2022-09-13 NOTE — Progress Notes (Signed)
Chief Complaint  Patient presents with   Medication Problem    Marvin Gates is 22 y.o. male here for ADHD follow up. Due to COVID-19 pandemic, we are interacting via web portal for an electronic face-to-face visit. I verified patient's ID using 2 identifiers. Patient agreed to proceed with visit via this method. Patient is at home, I am at office. Patient and I are present for visit.   Patient is currently on Vyvanse 40 mg/d and compliance is excellent. Symptoms are well controlled.  Side effects include: none. Patient believes their dose should be not significantly changed. He has been having issues with getting the medicine 2/2 supply issues.  Denies tics, weight loss, difficulties with sleep, self-medication, alcohol/drug abuse, chest pain, or palpitations.   Past Medical History:  Diagnosis Date   Allergic rhinitis    Asthma    Food allergy    High blood pressure    Objective No conversational dyspnea Age appropriate judgment and insight Nml affect and mood  Attention deficit disorder (ADD) without hyperactivity - Plan: methylphenidate 36 MG PO CR tablet  Chronic issue, will change Vyvanse to Concerta 36 mg/d. He has failed Adderall and Strattera. He will let me know if there are issues.  Pt voiced understanding and agreement to the plan.  [place], DO 09/13/22 1:57 PM

## 2022-09-21 ENCOUNTER — Other Ambulatory Visit: Payer: Self-pay | Admitting: Family Medicine

## 2022-09-21 ENCOUNTER — Encounter: Payer: Self-pay | Admitting: Family Medicine

## 2022-09-21 MED ORDER — METHYLPHENIDATE HCL ER (OSM) 54 MG PO TBCR: 54.0000 mg | EXTENDED_RELEASE_TABLET | ORAL | 0 refills | Status: DC

## 2022-10-15 ENCOUNTER — Other Ambulatory Visit: Payer: Self-pay | Admitting: Family Medicine

## 2022-10-15 ENCOUNTER — Other Ambulatory Visit (HOSPITAL_COMMUNITY): Payer: Self-pay

## 2022-10-17 ENCOUNTER — Other Ambulatory Visit: Payer: Self-pay | Admitting: Family Medicine

## 2022-10-17 ENCOUNTER — Other Ambulatory Visit (HOSPITAL_COMMUNITY): Payer: Self-pay

## 2022-10-17 ENCOUNTER — Other Ambulatory Visit: Payer: Self-pay

## 2022-10-17 ENCOUNTER — Encounter: Payer: Self-pay | Admitting: Family Medicine

## 2022-10-17 MED ORDER — METHYLPHENIDATE HCL ER (OSM) 54 MG PO TBCR: 54.0000 mg | EXTENDED_RELEASE_TABLET | ORAL | 0 refills | Status: DC

## 2022-10-17 MED ORDER — METHYLPHENIDATE HCL ER (OSM) 54 MG PO TBCR
54.0000 mg | EXTENDED_RELEASE_TABLET | ORAL | 0 refills | Status: DC
  Filled 2022-10-17: qty 30, 30d supply, fill #0

## 2022-10-17 MED ORDER — LISDEXAMFETAMINE DIMESYLATE 40 MG PO CHEW: 40.0000 mg | CHEWABLE_TABLET | Freq: Every day | ORAL | 0 refills | Status: DC

## 2022-10-17 NOTE — Telephone Encounter (Signed)
Requesting: Concerta [id number] Contract: No last was 09/20/21 UDS: 09/20/21 Last Visit: 08/15/2022 Next Visit: 02/14/2023 Last Refill: 09/21/22  Please Advise

## 2022-10-18 ENCOUNTER — Other Ambulatory Visit (HOSPITAL_COMMUNITY): Payer: Self-pay

## 2022-10-18 ENCOUNTER — Other Ambulatory Visit: Payer: Self-pay

## 2022-10-19 ENCOUNTER — Other Ambulatory Visit: Payer: Self-pay

## 2022-10-19 ENCOUNTER — Other Ambulatory Visit (HOSPITAL_COMMUNITY): Payer: Self-pay

## 2022-10-19 ENCOUNTER — Other Ambulatory Visit: Payer: Self-pay | Admitting: Family Medicine

## 2022-10-19 MED ORDER — LISDEXAMFETAMINE DIMESYLATE 40 MG PO CHEW
40.0000 mg | CHEWABLE_TABLET | Freq: Every day | ORAL | 0 refills | Status: DC
  Filled 2022-10-19 – 2022-11-04 (×3): qty 30, 30d supply, fill #0

## 2022-10-21 ENCOUNTER — Other Ambulatory Visit: Payer: Self-pay

## 2022-11-03 ENCOUNTER — Encounter: Payer: Self-pay | Admitting: Family Medicine

## 2022-11-04 ENCOUNTER — Other Ambulatory Visit (HOSPITAL_COMMUNITY): Payer: Self-pay

## 2022-11-04 ENCOUNTER — Other Ambulatory Visit: Payer: Self-pay | Admitting: Family Medicine

## 2022-11-04 ENCOUNTER — Other Ambulatory Visit: Payer: Self-pay

## 2022-11-04 DIAGNOSIS — F988 Other specified behavioral and emotional disorders with onset usually occurring in childhood and adolescence: Secondary | ICD-10-CM

## 2022-11-06 ENCOUNTER — Other Ambulatory Visit: Payer: Self-pay | Admitting: Family Medicine

## 2022-11-07 ENCOUNTER — Other Ambulatory Visit: Payer: Self-pay | Admitting: Family Medicine

## 2022-11-07 MED ORDER — BUPROPION HCL ER (XL) 300 MG PO TB24: ORAL_TABLET | ORAL | 3 refills | Status: DC

## 2022-12-06 ENCOUNTER — Other Ambulatory Visit: Payer: Self-pay | Admitting: Family Medicine

## 2022-12-06 MED ORDER — LISDEXAMFETAMINE DIMESYLATE 40 MG PO CHEW: 40.0000 mg | CHEWABLE_TABLET | Freq: Every day | ORAL | 0 refills | Status: DC

## 2022-12-06 NOTE — Telephone Encounter (Signed)
Last OV--09/13/2022 Last RF--10/19/2022

## 2022-12-07 ENCOUNTER — Other Ambulatory Visit: Payer: Self-pay | Admitting: Family Medicine

## 2022-12-07 ENCOUNTER — Encounter: Payer: Self-pay | Admitting: Family Medicine

## 2022-12-07 MED ORDER — LISDEXAMFETAMINE DIMESYLATE 40 MG PO CHEW
40.0000 mg | CHEWABLE_TABLET | Freq: Every day | ORAL | 0 refills | Status: DC
  Filled 2022-12-07 – 2023-01-06 (×3): qty 30, 30d supply, fill #0

## 2022-12-07 NOTE — Telephone Encounter (Signed)
Already sent in

## 2022-12-08 ENCOUNTER — Other Ambulatory Visit (HOSPITAL_COMMUNITY): Payer: Self-pay

## 2022-12-08 ENCOUNTER — Other Ambulatory Visit: Payer: Self-pay

## 2022-12-09 ENCOUNTER — Other Ambulatory Visit: Payer: Self-pay

## 2022-12-15 ENCOUNTER — Encounter (HOSPITAL_COMMUNITY): Payer: Self-pay | Admitting: Psychiatry

## 2022-12-15 ENCOUNTER — Ambulatory Visit (HOSPITAL_COMMUNITY): Payer: 59 | Admitting: Psychiatry

## 2022-12-15 DIAGNOSIS — F325 Major depressive disorder, single episode, in full remission: Secondary | ICD-10-CM | POA: Diagnosis not present

## 2022-12-15 DIAGNOSIS — F988 Other specified behavioral and emotional disorders with onset usually occurring in childhood and adolescence: Secondary | ICD-10-CM | POA: Diagnosis not present

## 2022-12-15 NOTE — Progress Notes (Signed)
Psychiatric Initial Adult Assessment   Patient Identification: Marvin Gates MRN:  [id number] Date of Evaluation:  12/15/2022 Referral Source: PCP Chief Complaint:   Chief Complaint  Patient presents with   Advice Only   Visit Diagnosis:    ICD-10-CM   1. Attention deficit disorder (ADD) without hyperactivity  F98.8     2. MDD (major depressive disorder), single episode, in full remission  F32.5        Assessment:  Marvin Gates is a 22 y.o. male with a history of MDD, ADHD, and HTN  who presented virtually to [facility] for initial evaluation on 12/15/2022.  Patient reports that his symptoms are currently stable on his medication regimen.  In the past he had experienced symptoms of depression including mood lability, increased irritability, excessive worry, passive SI, negative self thoughts.  He denies ever acting on SI.  Patient has been started on Wellbutrin at that time in addition to attending therapy which helped resolve his symptoms in conjunction with communicating his wishes to his family.  Patient has also been diagnosed with ADHD in the past and endorsed symptoms of poor focus/concentration, difficulty completing tasks, procrastination, and hyperactivity when he was younger.  The symptoms are well-controlled for the most part on his current regimen of Vyvanse.  We did review the adverse side effects of his medications primarily focusing on the potential effects of increasing blood pressure due to his past history of hypertension.  Patient's blood pressure is currently stable and he has had improvement after losing 50 pounds.  We reviewed some alternative medication options for if he did have worsening hypertension or wanted to try alternative medications in the future.  Psychosocially patient does have some residual grief following the passing of his father 2 years ago which she has yet to process.  He was connected with therapy resources to help  work through this.  Patient was also referred for neuropsych testing.  He will continue with his PCP for medication management and is aware that he can reach out in the future if symptoms ever progress or further consult is needed.  A number of assessments were performed during the evaluation today including PHQ-9 which they scored a 0 on, GAD-7 which they scored a 1 on, and [place] suicide severity screening which showed no risk.    Plan: - Continue Buproprion XL 300 mg QD managed by PCP  - Could taper and try Lexapro in the future if HTN is an issue - Continue Vyvanse 40 mg managed by PCP - Neuropsych therapy - Therapy referral - Crisis resources reviewed - Follow up in the future as needed  History of Present Illness:  Marvin Gates presents reporting that he wanted to connected with a psychiatric provider, to make sure that the medication he is taking is a good fit for him. In addition to establishing for easier access if his symptoms were to get worse in the future.    Marvin Gates reports that back in elementary school a couple of his teachers thought he had ADHD, however his family was against it. It was not until highschool he started to look into it more. Once he got into university he started medications for ADHD after having a discussion with his doctor.  He denies ever going through specific testing such as neuropsych testing.  Marvin Gates finds that his attention and focus improves on the medication for 4-6 hours, before wearing off.  He can still procrastinate especially if it is a  task he is less interested in, but notes that he does complete his assignments in time.  Marvin Gates reports that he also takes antidepressants and used to go to therapy in highschool.  At that time he had a lot of stress going on with the sports he was playing and concern of the future.  He endorses having thoughts of passive SI back then which have not recurred in over 4 years now. He would lash out towards his family, have extreme  mood swings from happy to sad with rapid fluctuation.  These were often secondary to stress from football and his family.  She notes that they had wanted him to get a scholarship for football and he was not succeeding in that for a long time. He did eventually get a scholarship, but opted not to play football once he got college.  Patient notes that while he had enjoyed football he did not enjoying it and not to live that lifestyle throughout his college career.  He felt that he would rather have a normal college experience and not be forced to always be working out for participating in football related activities.  Marvin Gates felt better after coming out to his family about his thoughts and feelings in these regards. They were at first against him doing this. Over a few months they came around and he started to feel better.  Currently he feels like he is doing pretty well on his medications.  He notes that he has One semester of college left, wants to apply to law school. Studying for the lsat, now.  Marvin Gates denies much anxiety related to this.  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  The biggest stressor he has is the passing of his father 2 years ago from stage IV cancer which he has not yet processed.  He can find himself thinking back towards this at times and quickly tries to change his thought line, as he will get panicky and upset if he does not.  He is interested in connecting with therapy to help process the emotions and feelings related to the passing of his father.  Patient was provided with resources for a therapist in the area.  As for his medications she reports that he has been taking Wellbutrin Wellbutrin since high school. It seems to work well and he denies any negative side effects. He notes having HTN prior to starting Wellbutrin. The HTN has gotten better the past year as he has lost 50 lbs. as for Vyvanse he has experienced appetite suppression, but makes sure he eats.  He  denies any other adverse side effects related to the medication.  We did review that hypertension was a potential side effect from both Wellbutrin and Vyvanse which patient knowledge.  He does feel that both are beneficial for him but would be open to tapering off the Wellbutrin and trying an alternative antidepressant/antianxiety medication in the future if his blood pressure were to get worse.  We did review some options for if he opted to go this route in the future.  As for his ADHD symptoms it was suggested he get official neuropsych testing to confirm the diagnosis and to ease the process of continuing on stimulant medications in the future if he were ever to change providers.  Associated Signs/Symptoms: Depression Symptoms:  decreased appetite, Mild anxiety (Hypo) Manic Symptoms:   Denies Anxiety Symptoms:   Mild anxiety Psychotic Symptoms:   Denies PTSD Symptoms: NA  Past  Psychiatric History: Patient denies any prior suicide attempts or past psychiatric hospitalizations.  He had been connected with a therapist during high school.  Tried Concerta during [employer]. Found it to be less effective.  Occasional social alcohol use. Had used nicotine and marijuana in the past. Denies any other substance use.   Previous Psychotropic Medications: Yes   Substance Abuse History in the last 12 months:  No.  Consequences of Substance Abuse: NA  Past Medical History:  Past Medical History:  Diagnosis Date   Allergic rhinitis    Asthma    Food allergy    High blood pressure     Past Surgical History:  Procedure Laterality Date   TONSILLECTOMY      Family Psychiatric History: Denies  Family History:  Family History  Problem Relation Age of Onset   Asthma Mother    Allergic rhinitis Mother    Food Allergy Mother        shellfish allergy   Diabetes Mother    High blood pressure Mother    Diabetes Father    High blood pressure Father    Angioedema Neg Hx    Eczema Neg Hx     Immunodeficiency Neg Hx    Urticaria Neg Hx     Social History:   Social History   Socioeconomic History   Marital status: Single    Spouse name: Not on file   Number of children: Not on file   Years of education: Not on file   Highest education level: Not on file  Occupational History   Not on file  Tobacco Use   Smoking status: Never   Smokeless tobacco: Never  Vaping Use   Vaping Use: Never used  Substance and Sexual Activity   Alcohol use: No    Alcohol/week: 0.0 standard drinks of alcohol   Drug use: No   Sexual activity: Not on file  Other Topics Concern   Not on file  Social History Narrative   Not on file   Social Determinants of Health   Financial Resource Strain: Not on file  Food Insecurity: Not on file  Transportation Needs: Not on file  Physical Activity: Not on file  Stress: Not on file  Social Connections: Not on file    Additional Social History: Has a girlfriend and good support from friends. Went to [employer] for a couple years and now Therapist, sports at [employer]. Had went to [employer] for the summer after getting a football scholarship, but decided football was not for him.   Allergies:   Allergies  Allergen Reactions   Banana Itching    Mouth itches   Wasp Venom Swelling    Metabolic Disorder Labs: No results found for: "HGBA1C", "MPG" No results found for: "PROLACTIN" Lab Results  Component Value Date   CHOL 219 (H) 08/15/2022   TRIG 149.0 08/15/2022   HDL 35.20 (L) 08/15/2022   CHOLHDL 6 08/15/2022   VLDL 29.8 08/15/2022   LDLCALC 154 (H) 08/15/2022   LDLCALC 137 (H) 09/20/2021   No results found for: "TSH"  Therapeutic Level Labs: No results found for: "LITHIUM" No results found for: "CBMZ" No results found for: "VALPROATE"  Current Medications: Current Outpatient Medications  Medication Sig Dispense Refill   amLODipine (NORVASC) 5 MG tablet TAKE 1 TABLET(5 MG) BY MOUTH DAILY 90 tablet 2    buPROPion (WELLBUTRIN XL) 300 MG 24 hr tablet TAKE 1 TABLET(300 MG) BY MOUTH DAILY 90 tablet 3  cetirizine (ZYRTEC) 10 MG tablet Take 1 tablet (10 mg total) by mouth daily as needed for allergies. 31 tablet 5   EPINEPHrine (EPIPEN 2-PAK) 0.3 mg/0.3 mL IJ SOAJ injection Use as directed for severe allergic reaction 2 each 2   FLOVENT HFA 110 MCG/ACT inhaler FOR ASTHMA FLARES TAKE 2 PUFFS TWICE A DAY WITH A SPACER FOR 2 WEEKS OR UNTIL COUGH AND WHEEZE FREE 12 g 5   Lisdexamfetamine Dimesylate (VYVANSE) 40 MG CHEW Chew 1 tablet (40 mg total) by mouth daily. 30 tablet 0   methylphenidate (CONCERTA) 54 MG PO CR tablet Take 1 tablet (54 mg total) by mouth every morning. 30 tablet 0   [START ON 12/16/2022] methylphenidate (CONCERTA) 54 MG PO CR tablet Take 1 tablet (54 mg total) by mouth every morning. 30 tablet 0   montelukast (SINGULAIR) 10 MG tablet TAKE 1 TABLET(10 MG) BY MOUTH AT BEDTIME 90 tablet 2   rosuvastatin (CRESTOR) 10 MG tablet Take 1 tablet (10 mg total) by mouth daily. 90 tablet 3   No current facility-administered medications for this visit.    Psychiatric Specialty Exam: Review of Systems  There were no vitals taken for this visit.There is no height or weight on file to calculate BMI.  General Appearance: Well Groomed  Eye Contact:  Good  Speech:  Clear and Coherent and Normal Rate  Volume:  Normal  Mood:  Euthymic  Affect:  Congruent  Thought Process:  Coherent, Goal Directed, and Linear  Orientation:  Full (Time, Place, and Person)  Thought Content:  Logical  Suicidal Thoughts:  No  Homicidal Thoughts:  No  Memory:  Immediate;   Good  Judgement:  Good  Insight:  Good  Psychomotor Activity:  Normal  Concentration:  Concentration: Good  Recall:  Good  Fund of Knowledge:Good  Language: Good  Akathisia:  NA    AIMS (if indicated):  not done  Assets:  Communication Skills Desire for Improvement Financial Resources/Insurance Housing Intimacy Physical  Health Resilience Social Support Talents/Skills Transportation Vocational/Educational  ADL's:  Intact  Cognition: WNL  Sleep:  Good   Screenings: GAD-7    Flowsheet Row [facility]/20[facility]  Total GAD-7 Score 1      PHQ2-9    Flowsheet Row Office Visit from 12/15/2022 in [facility] Office Visit from 09/20/2021 in [facility] Office Visit from 03/15/2021 in [facility] at [facility] Office Visit from 03/13/2020 in [facility] at [facility]  PHQ-2 Total Score 0 2 0 0  PHQ-9 Total Score -- 2 -- --      Flowsheet Row Office Visit from 12/15/2022 in [facility]  C-SSRS RISK CATEGORY No Risk        Collaboration of Care: Primary Care Provider AEB chart review  Patient/Guardian was advised Release of Information must be obtained prior to any record release in order to collaborate their care with an outside provider. Patient/Guardian was advised if they have not already done so to contact the registration department to sign all necessary forms in order for [place] to release information regarding their care.   Consent: Patient/Guardian gives verbal consent for treatment and assignment of benefits for services provided during this visit. Patient/Guardian expressed understanding and agreed to proceed.   Stasia Cavalier, MD 4/18/20249:52 AM  65 minutes were spent in chart review, interview, psycho education,  counseling, medical decision making, coordination of care and long-term prognosis.  Patient was given opportunity to ask question and all concerns and questions were addressed and answers. Excluding separately billable services.   Virtual Visit via Video Note  I connected with Marvin Gates on 12/15/22 at  9:00 AM EDT by a video enabled  telemedicine application and verified that I am speaking with the correct person using two identifiers.  Location: Patient: Home Provider: Home office   I discussed the limitations of evaluation and management by telemedicine and the availability of in person appointments. The patient expressed understanding and agreed to proceed.   I discussed the assessment and treatment plan with the patient. The patient was provided an opportunity to ask questions and all were answered. The patient agreed with the plan and demonstrated an understanding of the instructions.   The patient was advised to call back or seek an in-person evaluation if the symptoms worsen or if the condition fails to improve as anticipated.  I provided 40 minutes of non-face-to-face time during this encounter.   Stasia Cavalier, MD

## 2022-12-20 ENCOUNTER — Telehealth (HOSPITAL_COMMUNITY): Payer: Self-pay | Admitting: *Deleted

## 2022-12-20 NOTE — Telephone Encounter (Signed)
Referrals for ADHD testing made to [facility] and [facility].

## 2023-01-06 ENCOUNTER — Other Ambulatory Visit: Payer: Self-pay

## 2023-01-30 ENCOUNTER — Other Ambulatory Visit: Payer: Self-pay | Admitting: Family Medicine

## 2023-01-30 ENCOUNTER — Encounter: Payer: Self-pay | Admitting: Family Medicine

## 2023-01-30 MED ORDER — LISDEXAMFETAMINE DIMESYLATE 40 MG PO CAPS
40.0000 mg | ORAL_CAPSULE | ORAL | 0 refills | Status: DC
  Filled 2023-04-04: qty 30, 30d supply, fill #0

## 2023-01-30 MED ORDER — LISDEXAMFETAMINE DIMESYLATE 40 MG PO CAPS: 40.0000 mg | ORAL_CAPSULE | ORAL | 0 refills | Status: DC

## 2023-01-30 MED ORDER — LISDEXAMFETAMINE DIMESYLATE 40 MG PO CAPS
40.0000 mg | ORAL_CAPSULE | ORAL | 0 refills | Status: DC
  Filled 2023-01-30 – 2023-03-02 (×2): qty 30, 30d supply, fill #0

## 2023-01-30 NOTE — Telephone Encounter (Signed)
Last OV---09/13/2022 Last RF---12/07/2022

## 2023-01-30 NOTE — Telephone Encounter (Signed)
Called the patient and he does not have a preference whatever is available is fine.

## 2023-01-30 NOTE — Telephone Encounter (Signed)
Are we sticking with the Vyvanse chews? Going to send in 3 mo worth.

## 2023-01-31 ENCOUNTER — Other Ambulatory Visit: Payer: Self-pay

## 2023-01-31 ENCOUNTER — Other Ambulatory Visit (HOSPITAL_COMMUNITY): Payer: Self-pay

## 2023-01-31 MED ORDER — EPINEPHRINE 0.3 MG/0.3ML IJ SOAJ: INTRAMUSCULAR | 2 refills | Status: AC

## 2023-01-31 MED ORDER — EPINEPHRINE 0.3 MG/0.3ML IJ SOAJ
INTRAMUSCULAR | 2 refills | Status: DC
  Filled 2023-01-31: qty 2, fill #0

## 2023-01-31 NOTE — Addendum Note (Signed)
Addended by: Scharlene Gloss B on: 01/31/2023 07:00 AM   Modules accepted: Orders

## 2023-02-06 ENCOUNTER — Other Ambulatory Visit (HOSPITAL_COMMUNITY): Payer: Self-pay

## 2023-02-07 ENCOUNTER — Encounter: Payer: Self-pay | Admitting: Family Medicine

## 2023-02-08 ENCOUNTER — Other Ambulatory Visit: Payer: Self-pay | Admitting: Family Medicine

## 2023-02-08 MED ORDER — BUPROPION HCL ER (XL) 300 MG PO TB24: ORAL_TABLET | ORAL | 3 refills | Status: DC

## 2023-02-14 ENCOUNTER — Encounter: Payer: 59 | Admitting: Family Medicine

## 2023-03-03 ENCOUNTER — Other Ambulatory Visit: Payer: Self-pay

## 2023-03-03 ENCOUNTER — Other Ambulatory Visit (HOSPITAL_COMMUNITY): Payer: Self-pay

## 2023-03-06 ENCOUNTER — Other Ambulatory Visit (HOSPITAL_COMMUNITY): Payer: Self-pay

## 2023-03-17 ENCOUNTER — Ambulatory Visit (INDEPENDENT_AMBULATORY_CARE_PROVIDER_SITE_OTHER): Payer: 59 | Admitting: Family Medicine

## 2023-03-17 ENCOUNTER — Encounter: Payer: Self-pay | Admitting: Family Medicine

## 2023-03-17 VITALS — BP 110/70 | HR 84 | Temp 99.0°F | Ht 75.0 in | Wt 241.0 lb

## 2023-03-17 DIAGNOSIS — Z0000 Encounter for general adult medical examination without abnormal findings: Secondary | ICD-10-CM | POA: Diagnosis not present

## 2023-03-17 DIAGNOSIS — Z79899 Other long term (current) drug therapy: Secondary | ICD-10-CM | POA: Diagnosis not present

## 2023-03-17 LAB — CBC
HCT: 42.6 % (ref 38.5–50.0)
MCH: 31.7 pg (ref 27.0–33.0)
MCV: 92.4 fL (ref 80.0–100.0)
MPV: 13.1 fL — ABNORMAL HIGH (ref 7.5–12.5)
Platelets: 210 10*3/uL (ref 140–400)
RBC: 4.61 10*6/uL (ref 4.20–5.80)
RDW: 11.7 % (ref 11.0–15.0)
WBC: 6.3 10*3/uL (ref 3.8–10.8)

## 2023-03-17 NOTE — Patient Instructions (Addendum)
Give [place] 2-3 business days to get the results of your labs back.   Keep the diet clean and stay active.  Check your blood pressure at home every so often. If it is consistently lower than 110 on the top, please let me know.   Do monthly self testicular checks in the shower. You are feeling for lumps/bumps that don't belong. If you feel anything like this, let me know!  Let us know if you need anything.

## 2023-03-17 NOTE — Addendum Note (Signed)
Addended by: Mervin Kung A on: 03/17/2023 01:28 PM   Modules accepted: Orders

## 2023-03-17 NOTE — Progress Notes (Signed)
Chief Complaint  Patient presents with   Annual Exam    Well Male Marvin Gates is here for a complete physical.   His last physical was >1 year ago.  Current diet: in general, a "healthy" diet.   Current exercise:  Weight trend: intentionally losing Fatigue out of ordinary? No. Seat belt? Yes.   Advanced directive? No  Health maintenance Tetanus- Yes HIV- Yes Hep C- Yes  Past Medical History:  Diagnosis Date   Allergic rhinitis    Asthma    Food allergy    High blood pressure      Past Surgical History:  Procedure Laterality Date   TONSILLECTOMY      Medications  Current Outpatient Medications on File Prior to Visit  Medication Sig Dispense Refill   amLODipine (NORVASC) 5 MG tablet TAKE 1 TABLET(5 MG) BY MOUTH DAILY 90 tablet 2   buPROPion (WELLBUTRIN XL) 300 MG 24 hr tablet TAKE 1 TABLET(300 MG) BY MOUTH DAILY 90 tablet 3   cetirizine (ZYRTEC) 10 MG tablet Take 1 tablet (10 mg total) by mouth daily as needed for allergies. 31 tablet 5   EPINEPHrine (EPIPEN 2-PAK) 0.3 mg/0.3 mL IJ SOAJ injection Use as directed for severe allergic reaction 2 each 2   FLOVENT HFA 110 MCG/ACT inhaler FOR ASTHMA FLARES TAKE 2 PUFFS TWICE A DAY WITH A SPACER FOR 2 WEEKS OR UNTIL COUGH AND WHEEZE FREE 12 g 5   lisdexamfetamine (VYVANSE) 40 MG capsule Take 1 capsule (40 mg total) by mouth every morning. 30 capsule 0   [START ON 03/31/2023] lisdexamfetamine (VYVANSE) 40 MG capsule Take 1 capsule (40 mg total) by mouth every morning. 30 capsule 0   lisdexamfetamine (VYVANSE) 40 MG capsule Take 1 capsule (40 mg total) by mouth every morning. 30 capsule 0   montelukast (SINGULAIR) 10 MG tablet TAKE 1 TABLET(10 MG) BY MOUTH AT BEDTIME 90 tablet 2   rosuvastatin (CRESTOR) 10 MG tablet Take 1 tablet (10 mg total) by mouth daily. 90 tablet 3   Allergies Allergies  Allergen Reactions   Banana Itching    Mouth itches   Wasp Venom Swelling    Family History Family History  Problem Relation Age  of Onset   Asthma Mother    Allergic rhinitis Mother    Food Allergy Mother        shellfish allergy   Diabetes Mother    High blood pressure Mother    Diabetes Father    High blood pressure Father    Angioedema Neg Hx    Eczema Neg Hx    Immunodeficiency Neg Hx    Urticaria Neg Hx     Review of Systems: Constitutional: no fevers or chills Eye:  no recent significant change in vision Ear/Nose/Mouth/Throat:  Ears:  no hearing loss Nose/Mouth/Throat:  no complaints of nasal congestion, no sore throat Cardiovascular:  no chest pain Respiratory:  no shortness of breath Gastrointestinal:  no abdominal pain, no change in bowel habits GU:  Male: negative for dysuria Musculoskeletal/Extremities:  no pain of the joints Integumentary (Skin/Breast):  no abnormal skin lesions reported Neurologic:  no headaches Endocrine: No unexpected weight changes Hematologic/Lymphatic:  no night sweats  Exam BP 110/70 (BP Location: Left Arm, Patient Position: Sitting, Cuff Size: Large)   Pulse 84   Temp 99 F (37.2 C) (Oral)   Ht 6\' 3"  (1.905 m)   Wt 241 lb (109.3 kg)   SpO2 94%   BMI 30.12 kg/m  General:  well developed,  well nourished, in no apparent distress Skin:  no significant moles, warts, or growths Head:  no masses, lesions, or tenderness Eyes:  pupils equal and round, sclera anicteric without injection Ears:  canals without lesions, TMs shiny without retraction, no obvious effusion, no erythema Nose:  nares patent, mucosa normal Throat/Pharynx:  lips and gingiva without lesion; tongue and uvula midline; non-inflamed pharynx; no exudates or postnasal drainage Neck: neck supple without adenopathy, thyromegaly, or masses Lungs:  clear to auscultation, breath sounds equal bilaterally, no respiratory distress Cardio:  regular rate and rhythm, no bruits, no LE edema Abdomen:  abdomen soft, nontender; bowel sounds normal; no masses or organomegaly Genital (male): Deferred Rectal:  Deferred Musculoskeletal:  symmetrical muscle groups noted without atrophy or deformity Extremities:  no clubbing, cyanosis, or edema, no deformities, no skin discoloration Neuro:  gait normal; deep tendon reflexes normal and symmetric Psych: well oriented with normal range of affect and appropriate judgment/insight  Assessment and Plan  Well adult exam - Plan: CBC, Comprehensive metabolic panel, Lipid panel  Encounter for long-term (current) use of high-risk medication - Plan: Drug Monitoring Panel [phone] , Urine   Well 22 y.o. male. Counseled on diet and exercise. Self testicular exams recommended at least monthly.  UDS/CSC updated today.  Monitor BP at home and let me know if SBP <110, can maybe tae him off of Norvasc.  Advanced directive form provided today.  Other orders as above. Follow up in 6 mo pending the above workup. The patient voiced understanding and agreement to the plan.  Jilda Roche Casanova, DO 03/17/23 1:14 PM

## 2023-03-18 LAB — LIPID PANEL
Cholesterol: 97 mg/dL (ref <200)
HDL: 38 mg/dL — ABNORMAL LOW (ref >=40)
LDL Cholesterol (Calc): 45 mg/dL (calc)
Non-HDL Cholesterol (Calc): 59 mg/dL (calc) (ref <130)
Total CHOL/HDL Ratio: 2.6 (calc) (ref <5.0)
Triglycerides: 56 mg/dL (ref <150)

## 2023-03-18 LAB — COMPREHENSIVE METABOLIC PANEL
AG Ratio: 2.9 (calc) — ABNORMAL HIGH (ref 1.0–2.5)
ALT: 12 U/L (ref 9–46)
AST: 9 U/L — ABNORMAL LOW (ref 10–40)
Albumin: 5.3 g/dL — ABNORMAL HIGH (ref 3.6–5.1)
Alkaline phosphatase (APISO): 44 U/L (ref 36–130)
BUN: 14 mg/dL (ref 7–25)
CO2: 29 mmol/L (ref 20–32)
Calcium: 10.2 mg/dL (ref 8.6–10.3)
Chloride: 101 mmol/L (ref 98–110)
Creat: 1.05 mg/dL (ref 0.60–1.24)
Globulin: 1.8 g/dL (calc) — ABNORMAL LOW (ref 1.9–3.7)
Glucose, Bld: 89 mg/dL (ref 65–99)
Potassium: 4.3 mmol/L (ref 3.5–5.3)
Sodium: 138 mmol/L (ref 135–146)
Total Bilirubin: 0.8 mg/dL (ref 0.2–1.2)
Total Protein: 7.1 g/dL (ref 6.1–8.1)

## 2023-03-18 LAB — CBC
Hemoglobin: 14.6 g/dL (ref 13.2–17.1)
MCHC: 34.3 g/dL (ref 32.0–36.0)

## 2023-03-19 LAB — DRUG MONITORING PANEL 376104, URINE
Amphetamine: 5098 ng/mL — ABNORMAL HIGH (ref <250)
Amphetamines: POSITIVE ng/mL — AB (ref <500)
Barbiturates: NEGATIVE ng/mL (ref <300)
Benzodiazepines: NEGATIVE ng/mL (ref <100)
Cocaine Metabolite: NEGATIVE ng/mL (ref <150)
Desmethyltramadol: NEGATIVE ng/mL (ref <100)
Desmethyltramadol: NEGATIVE ng/mL (ref <100)
Methamphetamine: NEGATIVE ng/mL (ref <250)
Opiates: NEGATIVE ng/mL (ref <100)
Oxycodone: NEGATIVE ng/mL (ref <100)
Tramadol: NEGATIVE ng/mL (ref <100)

## 2023-03-19 LAB — DM TEMPLATE

## 2023-04-04 ENCOUNTER — Other Ambulatory Visit (HOSPITAL_COMMUNITY): Payer: Self-pay

## 2023-05-02 ENCOUNTER — Telehealth: Payer: 59 | Admitting: Family Medicine

## 2023-05-05 ENCOUNTER — Other Ambulatory Visit: Payer: Self-pay | Admitting: Family Medicine

## 2023-05-10 ENCOUNTER — Other Ambulatory Visit (HOSPITAL_COMMUNITY): Payer: Self-pay

## 2023-05-10 ENCOUNTER — Other Ambulatory Visit: Payer: Self-pay | Admitting: Family Medicine

## 2023-05-10 ENCOUNTER — Other Ambulatory Visit: Payer: Self-pay

## 2023-05-10 MED ORDER — LISDEXAMFETAMINE DIMESYLATE 40 MG PO CAPS
40.0000 mg | ORAL_CAPSULE | ORAL | 0 refills | Status: DC
  Filled 2023-06-20: qty 30, 30d supply, fill #0

## 2023-05-10 MED ORDER — LISDEXAMFETAMINE DIMESYLATE 40 MG PO CAPS
40.0000 mg | ORAL_CAPSULE | ORAL | 0 refills | Status: DC
  Filled 2023-07-24: qty 30, 30d supply, fill #0

## 2023-05-10 MED ORDER — LISDEXAMFETAMINE DIMESYLATE 40 MG PO CAPS
40.0000 mg | ORAL_CAPSULE | ORAL | 0 refills | Status: DC
  Filled 2023-05-10: qty 30, 30d supply, fill #0

## 2023-05-10 NOTE — Telephone Encounter (Signed)
Last OV--03/17/2023 Last RF--03/31/23

## 2023-05-11 ENCOUNTER — Other Ambulatory Visit: Payer: Self-pay

## 2023-06-13 ENCOUNTER — Other Ambulatory Visit: Payer: Self-pay | Admitting: Family Medicine

## 2023-06-20 ENCOUNTER — Other Ambulatory Visit (HOSPITAL_COMMUNITY): Payer: Self-pay

## 2023-06-20 ENCOUNTER — Other Ambulatory Visit: Payer: Self-pay

## 2023-07-18 ENCOUNTER — Other Ambulatory Visit (HOSPITAL_COMMUNITY): Payer: Self-pay

## 2023-07-24 ENCOUNTER — Other Ambulatory Visit: Payer: Self-pay

## 2023-08-04 ENCOUNTER — Other Ambulatory Visit: Payer: Self-pay | Admitting: Family Medicine

## 2023-08-18 ENCOUNTER — Other Ambulatory Visit: Payer: Self-pay | Admitting: Family Medicine

## 2023-08-18 DIAGNOSIS — U071 COVID-19: Secondary | ICD-10-CM

## 2023-08-27 ENCOUNTER — Other Ambulatory Visit: Payer: Self-pay | Admitting: Family Medicine

## 2023-09-02 ENCOUNTER — Other Ambulatory Visit: Payer: Self-pay | Admitting: Family Medicine

## 2023-09-04 ENCOUNTER — Other Ambulatory Visit: Payer: Self-pay

## 2023-09-04 MED ORDER — LISDEXAMFETAMINE DIMESYLATE 40 MG PO CAPS
40.0000 mg | ORAL_CAPSULE | ORAL | 0 refills | Status: DC
  Filled 2023-09-04: qty 30, 30d supply, fill #0

## 2023-09-04 NOTE — Telephone Encounter (Signed)
 He's due for an appt by the end of the mo. Plz sched his 6 mo med ck. Ty.

## 2023-10-01 ENCOUNTER — Encounter: Payer: Self-pay | Admitting: Family Medicine

## 2023-10-03 ENCOUNTER — Telehealth (INDEPENDENT_AMBULATORY_CARE_PROVIDER_SITE_OTHER): Payer: 59 | Admitting: Family Medicine

## 2023-10-03 ENCOUNTER — Other Ambulatory Visit (HOSPITAL_COMMUNITY): Payer: Self-pay

## 2023-10-03 ENCOUNTER — Other Ambulatory Visit: Payer: Self-pay

## 2023-10-03 VITALS — Ht 75.0 in | Wt 240.0 lb

## 2023-10-03 DIAGNOSIS — J069 Acute upper respiratory infection, unspecified: Secondary | ICD-10-CM

## 2023-10-03 DIAGNOSIS — I10 Essential (primary) hypertension: Secondary | ICD-10-CM | POA: Diagnosis not present

## 2023-10-03 MED ORDER — BENZONATATE 100 MG PO CAPS
100.0000 mg | ORAL_CAPSULE | Freq: Two times a day (BID) | ORAL | 0 refills | Status: DC | PRN
  Filled 2023-10-03: qty 20, 10d supply, fill #0

## 2023-10-03 MED ORDER — ALBUTEROL SULFATE HFA 108 (90 BASE) MCG/ACT IN AERS
2.0000 | INHALATION_SPRAY | Freq: Four times a day (QID) | RESPIRATORY_TRACT | 0 refills | Status: DC | PRN
  Filled 2023-10-03: qty 6.7, 20d supply, fill #0

## 2023-10-03 NOTE — Progress Notes (Addendum)
 Virtual Visit via Video Note   This patient is at least at moderate risk for complications without adequate follow up. This format is felt to be most appropriate for this patient at this time. Physical exam was limited by quality of the video and audio technology used for the visit. Juanetta, CM was able to get the patient set up on a video visit.  Patient location: Home Patient and provider in visit Provider location: Office Subjective:    Patient ID: PIERS BAADE, male    DOB: 2001-03-17, 23 y.o.   MRN: [id number]  Chief Complaint  Patient presents with   Covid Positive    HPI Discussed the use of AI scribe software for clinical note transcription with the patient, who gave verbal consent to proceed.  History of Present Illness        Patient is a 23 year old male here today for evaluation of respiratory illness. Struggling with cough, head congestion and malaise. No high grade fevers or chills, no chest pain or tightness.     Past Medical History:  Diagnosis Date   Allergic rhinitis    Asthma    Food allergy    High blood pressure     Past Surgical History:  Procedure Laterality Date   TONSILLECTOMY      Family History  Problem Relation Age of Onset   Asthma Mother    Allergic rhinitis Mother    Food Allergy Mother        shellfish allergy   Diabetes Mother    High blood pressure Mother    Diabetes Father    High blood pressure Father    Angioedema Neg Hx    Eczema Neg Hx    Immunodeficiency Neg Hx    Urticaria Neg Hx     Social History   Socioeconomic History   Marital status: Single    Spouse name: Not on file   Number of children: Not on file   Years of education: Not on file   Highest education level: Not on file  Occupational History   Not on file  Tobacco Use   Smoking status: Never   Smokeless tobacco: Never  Vaping Use   Vaping status: Never Used  Substance and Sexual Activity   Alcohol use: No    Alcohol/week: 0.0 standard drinks of  alcohol   Drug use: No   Sexual activity: Not on file  Other Topics Concern   Not on file  Social History Narrative   Not on file   Social Drivers of Health   Financial Resource Strain: Not on file  Food Insecurity: Not on file  Transportation Needs: Not on file  Physical Activity: Not on file  Stress: Not on file  Social Connections: Not on file  Intimate Partner Violence: Not on file    Outpatient Medications Prior to Visit  Medication Sig Dispense Refill   amLODipine  (NORVASC ) 5 MG tablet TAKE 1 TABLET(5 MG) BY MOUTH DAILY 90 tablet 2   buPROPion  (WELLBUTRIN  XL) 300 MG 24 hr tablet TAKE 1 TABLET(300 MG) BY MOUTH DAILY 90 tablet 3   cetirizine  (ZYRTEC ) 10 MG tablet Take 1 tablet (10 mg total) by mouth daily as needed for allergies. 31 tablet 5   EPINEPHrine  (EPIPEN  2-PAK) 0.3 mg/0.3 mL IJ SOAJ injection Use as directed for severe allergic reaction 2 each 2   lisdexamfetamine (VYVANSE ) 40 MG capsule Take 1 capsule (40 mg total) by mouth every morning. 30 capsule 0   lisdexamfetamine (VYVANSE )  40 MG capsule Take 1 capsule (40 mg total) by mouth every morning. 30 capsule 0   lisdexamfetamine (VYVANSE ) 40 MG capsule Take 1 capsule (40 mg total) by mouth every morning. 30 capsule 0   montelukast  (SINGULAIR ) 10 MG tablet TAKE 1 TABLET(10 MG) BY MOUTH AT BEDTIME 90 tablet 2   rosuvastatin  (CRESTOR ) 10 MG tablet TAKE 1 TABLET BY MOUTH EVERY DAY 90 tablet 3   FLOVENT  HFA 110 MCG/ACT inhaler FOR ASTHMA FLARES TAKE 2 PUFFS TWICE A DAY WITH A SPACER FOR 2 WEEKS OR UNTIL COUGH AND WHEEZE FREE 12 g 5   No facility-administered medications prior to visit.    Allergies  Allergen Reactions   Banana Itching    Mouth itches   Wasp Venom Swelling    Review of Systems  Constitutional:  Positive for fever and malaise/fatigue. Negative for chills.  HENT:  Positive for congestion.   Eyes:  Negative for blurred vision.  Respiratory:  Positive for cough. Negative for shortness of breath.    Cardiovascular:  Negative for chest pain, palpitations and leg swelling.  Gastrointestinal:  Negative for abdominal pain, blood in stool and nausea.  Genitourinary:  Negative for dysuria and frequency.  Musculoskeletal:  Negative for falls.  Skin:  Negative for rash.  Neurological:  Negative for dizziness, loss of consciousness and headaches.  Endo/Heme/Allergies:  Negative for environmental allergies.  Psychiatric/Behavioral:  Negative for depression. The patient is not nervous/anxious.       Objective:    Physical Exam Constitutional:      General: He is not in acute distress.    Appearance: Normal appearance. He is not ill-appearing or toxic-appearing.  HENT:     Head: Normocephalic and atraumatic.     Right Ear: External ear normal.     Left Ear: External ear normal.     Nose: Nose normal.  Eyes:     General:        Right eye: No discharge.        Left eye: No discharge.  Pulmonary:     Effort: Pulmonary effort is normal.  Skin:    Findings: No rash.  Neurological:     Mental Status: He is alert and oriented to person, place, and time.  Psychiatric:        Behavior: Behavior normal.   Ht 6' 3 (1.905 m) Comment: Pt stated  Wt 240 lb (108.9 kg) Comment: Pt stated  BMI 30.00 kg/m  Wt Readings from Last 3 Encounters:  10/03/23 240 lb (108.9 kg)  03/17/23 241 lb (109.3 kg)  08/15/22 255 lb (115.7 kg)    Diabetic Foot Exam - Simple   No data filed    Lab Results  Component Value Date   WBC 6.3 03/17/2023   HGB 14.6 03/17/2023   HCT 42.6 03/17/2023   PLT 210 03/17/2023   GLUCOSE 89 03/17/2023   CHOL 97 03/17/2023   TRIG 56 03/17/2023   HDL 38 (L) 03/17/2023   LDLDIRECT 144.0 02/07/2018   LDLCALC 45 03/17/2023   ALT 12 03/17/2023   AST 9 (L) 03/17/2023   NA 138 03/17/2023   K 4.3 03/17/2023   CL 101 03/17/2023   CREATININE 1.05 03/17/2023   BUN 14 03/17/2023   CO2 29 03/17/2023    No results found for: TSH Lab Results  Component Value Date    WBC 6.3 03/17/2023   HGB 14.6 03/17/2023   HCT 42.6 03/17/2023   MCV 92.4 03/17/2023   PLT 210 03/17/2023  Lab Results  Component Value Date   NA 138 03/17/2023   K 4.3 03/17/2023   CO2 29 03/17/2023   GLUCOSE 89 03/17/2023   BUN 14 03/17/2023   CREATININE 1.05 03/17/2023   BILITOT 0.8 03/17/2023   ALKPHOS 46 08/15/2022   AST 9 (L) 03/17/2023   ALT 12 03/17/2023   PROT 7.1 03/17/2023   ALBUMIN 5.3 (H) 08/15/2022   CALCIUM  10.2 03/17/2023   GFR 94.88 08/15/2022   Lab Results  Component Value Date   CHOL 97 03/17/2023   Lab Results  Component Value Date   HDL 38 (L) 03/17/2023   Lab Results  Component Value Date   LDLCALC 45 03/17/2023   Lab Results  Component Value Date   TRIG 56 03/17/2023   Lab Results  Component Value Date   CHOLHDL 2.6 03/17/2023   No results found for: HGBA1C     Assessment & Plan:  There are no diagnoses linked to this encounter.  Assessment and Plan              Harlene Horton, MD

## 2023-10-25 ENCOUNTER — Other Ambulatory Visit: Payer: Self-pay | Admitting: Family Medicine

## 2023-10-25 ENCOUNTER — Other Ambulatory Visit (HOSPITAL_COMMUNITY): Payer: Self-pay

## 2023-10-25 ENCOUNTER — Other Ambulatory Visit: Payer: Self-pay

## 2023-10-25 MED ORDER — LISDEXAMFETAMINE DIMESYLATE 40 MG PO CAPS
40.0000 mg | ORAL_CAPSULE | ORAL | 0 refills | Status: DC
Start: 2023-10-25 — End: 2023-11-13
  Filled 2023-10-25: qty 30, 30d supply, fill #0

## 2023-10-25 NOTE — Telephone Encounter (Signed)
 Requesting: Vyvanse 40 mg  Contract: 03/17/2023 UDS: 03/17/2023 Last Visit: 10/03/2023 VV  Next Visit: N/A Last Refill: 09/04/2023  Please Advise

## 2023-10-25 NOTE — Assessment & Plan Note (Signed)
 Monitor BP and report any concerns

## 2023-10-25 NOTE — Telephone Encounter (Signed)
 Sent Pt mychart message letting him know he needs visit for refill on his Vyvanse.

## 2023-10-25 NOTE — Assessment & Plan Note (Signed)
 Encouraged increased rest and hydration, add probiotics, zinc such as Coldeze or Xicam. Treat fevers as needed given new prescription for Albuterol and Benzonatate to use prn. Notify us if worsens.

## 2023-11-05 ENCOUNTER — Other Ambulatory Visit: Payer: Self-pay | Admitting: Family Medicine

## 2023-11-06 ENCOUNTER — Encounter: Payer: Self-pay | Admitting: *Deleted

## 2023-11-13 ENCOUNTER — Other Ambulatory Visit: Payer: Self-pay

## 2023-11-13 ENCOUNTER — Encounter: Payer: Self-pay | Admitting: Family Medicine

## 2023-11-13 ENCOUNTER — Ambulatory Visit: Admitting: Family Medicine

## 2023-11-13 ENCOUNTER — Other Ambulatory Visit (HOSPITAL_COMMUNITY): Payer: Self-pay

## 2023-11-13 VITALS — BP 130/88 | HR 83 | Temp 98.0°F | Ht 75.0 in | Wt 246.6 lb

## 2023-11-13 DIAGNOSIS — F988 Other specified behavioral and emotional disorders with onset usually occurring in childhood and adolescence: Secondary | ICD-10-CM | POA: Diagnosis not present

## 2023-11-13 DIAGNOSIS — I10 Essential (primary) hypertension: Secondary | ICD-10-CM

## 2023-11-13 DIAGNOSIS — E7800 Pure hypercholesterolemia, unspecified: Secondary | ICD-10-CM | POA: Diagnosis not present

## 2023-11-13 LAB — COMPREHENSIVE METABOLIC PANEL
ALT: 14 U/L (ref 0–53)
AST: 11 U/L (ref 0–37)
Albumin: 5.2 g/dL (ref 3.5–5.2)
Alkaline Phosphatase: 45 U/L (ref 39–117)
BUN: 12 mg/dL (ref 6–23)
CO2: 30 meq/L (ref 19–32)
Calcium: 9.8 mg/dL (ref 8.4–10.5)
Chloride: 102 meq/L (ref 96–112)
Creatinine, Ser: 0.94 mg/dL (ref 0.40–1.50)
GFR: 113.58 mL/min (ref >60.00)
Glucose, Bld: 86 mg/dL (ref 70–99)
Potassium: 4.1 meq/L (ref 3.5–5.1)
Sodium: 138 meq/L (ref 135–145)
Total Bilirubin: 0.5 mg/dL (ref 0.2–1.2)
Total Protein: 6.9 g/dL (ref 6.0–8.3)

## 2023-11-13 LAB — LIPID PANEL
Cholesterol: 116 mg/dL (ref 0–200)
HDL: 44.5 mg/dL (ref >39.00)
LDL Cholesterol: 54 mg/dL (ref 0–99)
NonHDL: 71.14
Total CHOL/HDL Ratio: 3
Triglycerides: 85 mg/dL (ref 0.0–149.0)
VLDL: 17 mg/dL (ref 0.0–40.0)

## 2023-11-13 MED ORDER — BUPROPION HCL ER (XL) 150 MG PO TB24
150.0000 mg | ORAL_TABLET | Freq: Every day | ORAL | 0 refills | Status: DC
  Filled 2023-11-13: qty 30, 30d supply, fill #0

## 2023-11-13 MED ORDER — LISDEXAMFETAMINE DIMESYLATE 50 MG PO CAPS
50.0000 mg | ORAL_CAPSULE | Freq: Every day | ORAL | 0 refills | Status: DC
  Filled 2023-11-13 – 2023-12-06 (×2): qty 30, 30d supply, fill #0

## 2023-11-13 NOTE — Progress Notes (Signed)
 Chief Complaint  Patient presents with   Follow-up    Patient presents today for a medication follow-up    [name] is 23 y.o. male here for ADHD follow up.  Patient is currently on Vyvanse 40 mg/d and compliance is excellent. Symptoms include: doing well but having some inattention with a new job. Side effects include none. Patient believes their dose should be increased. Denies tics, weight loss, difficulties with sleep, self-medication, alcohol/drug abuse, chest pain, or palpitations.  Hypertension Patient presents for hypertension follow up. He does not monitor home blood pressures. He is compliant with medication- Norvasc 5 mg/d. Patient has these side effects of medication: none He is not always adhering to a healthy diet overall. Exercise: none No CP or SOB.   Hyperlipidemia Patient presents for hyperlipidemia follow up. Currently being treated with Crestor 10 mg/d and compliance with treatment thus far has been good. He denies myalgias. Diet/exercise as above.  The patient is not known to have coexisting coronary artery disease.  Past Medical History:  Diagnosis Date   Allergic rhinitis    Asthma    Food allergy    High blood pressure     BP 130/88   Pulse 83   Temp 98 F (36.7 C)   Ht 6\' 3"  (1.905 m)   Wt 246 lb 9.6 oz (111.9 kg)   SpO2 96%   BMI 30.82 kg/m  Gen- awake, alert, appearing stated age Heart- RRR, no bruits, no LE edma Lungs- CTAB, no accessory muscle use Neuro- no facial tics Psych- age appropriate judgment and insight, normal mood and affect  Attention deficit disorder (ADD) without hyperactivity  Essential hypertension  Pure hypercholesterolemia - Plan: Comprehensive metabolic panel, Lipid panel  Chronic, not fully stable.  Increase Vyvanse from 40 mg daily to 50 mg daily.  He will let me know if he does well with this. Chronic, stable.  Continue Norvasc 5 mg exercise.  Continue to improve on this.  He has lost quite a bit of  weight and might be able to come off of this medication. Chronic, stable.  Continue Crestor 10 mg daily. F/u in 6 mo for CPE. Pt voiced understanding and agreement to the plan.  Jilda Roche [place], DO 11/13/23 11:25 AM

## 2023-11-13 NOTE — Patient Instructions (Signed)
 Give [place] 2-3 business days to get the results of your labs back.   Keep the diet clean and stay active.  Let us know if you need anything.

## 2023-11-14 ENCOUNTER — Other Ambulatory Visit: Payer: Self-pay

## 2023-11-14 ENCOUNTER — Encounter: Payer: Self-pay | Admitting: Family Medicine

## 2023-12-06 ENCOUNTER — Other Ambulatory Visit (HOSPITAL_COMMUNITY): Payer: Self-pay

## 2023-12-06 ENCOUNTER — Other Ambulatory Visit: Payer: Self-pay

## 2024-01-06 ENCOUNTER — Other Ambulatory Visit: Payer: Self-pay | Admitting: Family Medicine

## 2024-01-08 ENCOUNTER — Other Ambulatory Visit (HOSPITAL_COMMUNITY): Payer: Self-pay

## 2024-01-08 ENCOUNTER — Other Ambulatory Visit: Payer: Self-pay

## 2024-01-08 MED ORDER — LISDEXAMFETAMINE DIMESYLATE 50 MG PO CAPS
50.0000 mg | ORAL_CAPSULE | Freq: Every day | ORAL | 0 refills | Status: DC
  Filled 2024-01-08 – 2024-01-12 (×2): qty 30, 30d supply, fill #0

## 2024-01-08 MED ORDER — LISDEXAMFETAMINE DIMESYLATE 50 MG PO CAPS
50.0000 mg | ORAL_CAPSULE | Freq: Every day | ORAL | 0 refills | Status: DC
  Filled 2024-02-14: qty 30, 30d supply, fill #0

## 2024-01-08 MED ORDER — LISDEXAMFETAMINE DIMESYLATE 50 MG PO CAPS
50.0000 mg | ORAL_CAPSULE | Freq: Every day | ORAL | 0 refills | Status: DC
Start: 2024-03-08 — End: 2024-04-17
  Filled 2024-03-17: qty 30, 30d supply, fill #0

## 2024-01-12 ENCOUNTER — Other Ambulatory Visit: Payer: Self-pay

## 2024-01-12 ENCOUNTER — Other Ambulatory Visit (HOSPITAL_COMMUNITY): Payer: Self-pay

## 2024-01-31 ENCOUNTER — Other Ambulatory Visit (HOSPITAL_COMMUNITY): Payer: Self-pay

## 2024-01-31 ENCOUNTER — Other Ambulatory Visit: Payer: Self-pay | Admitting: Family Medicine

## 2024-01-31 MED ORDER — BUPROPION HCL ER (XL) 150 MG PO TB24
150.0000 mg | ORAL_TABLET | Freq: Every day | ORAL | 1 refills | Status: AC
  Filled 2024-01-31 – 2024-02-14 (×2): qty 30, 30d supply, fill #0
  Filled 2024-03-17: qty 30, 30d supply, fill #1
  Filled 2024-04-17: qty 30, 30d supply, fill #2
  Filled 2024-06-28: qty 30, 30d supply, fill #3
  Filled 2024-07-31: qty 30, 30d supply, fill #4
  Filled 2024-09-12: qty 30, 30d supply, fill #5

## 2024-02-02 ENCOUNTER — Other Ambulatory Visit: Payer: Self-pay | Admitting: Family Medicine

## 2024-02-04 ENCOUNTER — Encounter: Payer: Self-pay | Admitting: Family Medicine

## 2024-02-09 ENCOUNTER — Other Ambulatory Visit (HOSPITAL_COMMUNITY): Payer: Self-pay

## 2024-02-14 ENCOUNTER — Other Ambulatory Visit: Payer: Self-pay

## 2024-03-04 ENCOUNTER — Ambulatory Visit: Admitting: Family Medicine

## 2024-03-08 ENCOUNTER — Ambulatory Visit (INDEPENDENT_AMBULATORY_CARE_PROVIDER_SITE_OTHER): Admitting: Family Medicine

## 2024-03-08 ENCOUNTER — Telehealth: Payer: Self-pay

## 2024-03-08 ENCOUNTER — Other Ambulatory Visit (HOSPITAL_COMMUNITY): Payer: Self-pay

## 2024-03-08 ENCOUNTER — Other Ambulatory Visit: Payer: Self-pay

## 2024-03-08 ENCOUNTER — Encounter: Payer: Self-pay | Admitting: Family Medicine

## 2024-03-08 VITALS — BP 128/82 | HR 100 | Temp 98.0°F | Resp 16 | Ht 75.0 in | Wt 229.0 lb

## 2024-03-08 DIAGNOSIS — R21 Rash and other nonspecific skin eruption: Secondary | ICD-10-CM

## 2024-03-08 DIAGNOSIS — F988 Other specified behavioral and emotional disorders with onset usually occurring in childhood and adolescence: Secondary | ICD-10-CM

## 2024-03-08 MED ORDER — TRIAMCINOLONE ACETONIDE 0.1 % EX CREA
1.0000 | TOPICAL_CREAM | Freq: Two times a day (BID) | CUTANEOUS | 0 refills | Status: DC
  Filled 2024-03-08: qty 30, 15d supply, fill #0

## 2024-03-08 NOTE — Progress Notes (Addendum)
 Chief Complaint  Patient presents with   Follow-up    Follow Up    Marvin Gates is a 23 y.o. male here for a skin complaint.  Duration: 2 months Location: R lower leg Pruritic? Sometimes Painful? No Drainage? No New soaps/lotions/topicals/detergents? No Trauma? No Other associated symptoms: not spreading currently; no fevers Therapies tried thus far: various salves  ADHD Patient has a history of ADHD on Vyvanse  50 mg daily as needed.  Reports compliance and no adverse effects.  His symptoms are mostly controlled.  They could be better.  He is studying for the LSAT and is set to take the test in November of this year.  He is requesting a time extension due to this diagnosis.  Past Medical History:  Diagnosis Date   Allergic rhinitis    Asthma    Food allergy    High blood pressure     BP 128/82 (BP Location: Left Arm, Patient Position: Sitting)   Pulse 100   Temp 98 F (36.7 C) (Oral)   Resp 16   Ht 6' 3 (1.905 m)   Wt 229 lb (103.9 kg)   SpO2 99%   BMI 28.62 kg/m  Gen: awake, alert, appearing stated age Lungs: No accessory muscle use Skin: See below. + Scaly.  No drainage, erythema, TTP, fluctuance, excoriation Psych: Age appropriate judgment and insight   Right medial lower extremity  Scaly patch rash - Plan: triamcinolone  cream (KENALOG ) 0.1 %, Ambulatory referral to Dermatology  Attention deficit disorder (ADD) without hyperactivity  Trial Kenalog  0.1% twice daily for 14 days.  Refer to dermatology at his request. He has a form for accommodation for ADHD for the LSAT.  It was filled out today. F/u in 3 months for physical or as needed. The patient voiced understanding and agreement to the plan.  I spent 30 minutes with the patient discussing the above plans in addition to reviewing his chart and filling out a form on the same day of the visit.  Mabel [place], DO 03/08/24 3:25 PM

## 2024-03-08 NOTE — Addendum Note (Signed)
 Addended by: FRANN MABEL SQUIBB on: 03/08/2024 04:35 PM   Modules accepted: Level of Service

## 2024-03-11 ENCOUNTER — Telehealth: Payer: Self-pay

## 2024-03-11 NOTE — Telephone Encounter (Signed)
 Pt came to pick up papers but needs scratch paper and writing utensils. Placed in pcps box. Please call pt when ready.

## 2024-03-11 NOTE — Telephone Encounter (Signed)
 done

## 2024-03-18 ENCOUNTER — Other Ambulatory Visit: Payer: Self-pay

## 2024-03-18 ENCOUNTER — Other Ambulatory Visit (HOSPITAL_COMMUNITY): Payer: Self-pay

## 2024-03-30 ENCOUNTER — Other Ambulatory Visit: Payer: Self-pay | Admitting: Family Medicine

## 2024-04-17 ENCOUNTER — Other Ambulatory Visit (HOSPITAL_COMMUNITY): Payer: Self-pay

## 2024-04-17 ENCOUNTER — Other Ambulatory Visit: Payer: Self-pay

## 2024-04-17 ENCOUNTER — Other Ambulatory Visit: Payer: Self-pay | Admitting: Family Medicine

## 2024-04-17 MED ORDER — LISDEXAMFETAMINE DIMESYLATE 50 MG PO CAPS
50.0000 mg | ORAL_CAPSULE | Freq: Every day | ORAL | 0 refills | Status: DC
  Filled 2024-04-17: qty 30, 30d supply, fill #0

## 2024-04-17 MED ORDER — LISDEXAMFETAMINE DIMESYLATE 50 MG PO CAPS
50.0000 mg | ORAL_CAPSULE | Freq: Every day | ORAL | 0 refills | Status: DC
  Filled 2024-06-28: qty 30, 30d supply, fill #0

## 2024-04-17 MED ORDER — LISDEXAMFETAMINE DIMESYLATE 50 MG PO CAPS
50.0000 mg | ORAL_CAPSULE | Freq: Every day | ORAL | 0 refills | Status: DC
  Filled 2024-05-30: qty 30, 30d supply, fill #0

## 2024-04-17 NOTE — Telephone Encounter (Signed)
 Requesting: Vyvanse  50mg   Contract:03/17/23 UDS:03/17/23 Last Visit: 03/08/24 Next Visit: 06/21/24 Last Refill: See med list  Please Advise

## 2024-05-08 ENCOUNTER — Other Ambulatory Visit: Payer: Self-pay | Admitting: Family Medicine

## 2024-05-30 ENCOUNTER — Other Ambulatory Visit (HOSPITAL_COMMUNITY): Payer: Self-pay

## 2024-05-30 ENCOUNTER — Other Ambulatory Visit: Payer: Self-pay | Admitting: Family Medicine

## 2024-05-30 ENCOUNTER — Other Ambulatory Visit: Payer: Self-pay

## 2024-05-31 ENCOUNTER — Other Ambulatory Visit (HOSPITAL_COMMUNITY): Payer: Self-pay

## 2024-06-21 ENCOUNTER — Encounter: Payer: Self-pay | Admitting: Family Medicine

## 2024-06-21 ENCOUNTER — Ambulatory Visit (INDEPENDENT_AMBULATORY_CARE_PROVIDER_SITE_OTHER): Admitting: Family Medicine

## 2024-06-21 ENCOUNTER — Ambulatory Visit: Payer: Self-pay | Admitting: Family Medicine

## 2024-06-21 VITALS — BP 122/78 | HR 79 | Temp 98.0°F | Resp 16 | Ht 75.0 in | Wt 233.6 lb

## 2024-06-21 DIAGNOSIS — Z79899 Other long term (current) drug therapy: Secondary | ICD-10-CM | POA: Diagnosis not present

## 2024-06-21 DIAGNOSIS — Z0000 Encounter for general adult medical examination without abnormal findings: Secondary | ICD-10-CM

## 2024-06-21 LAB — CBC
HCT: 40.7 % (ref 39.0–52.0)
Hemoglobin: 13.9 g/dL (ref 13.0–17.0)
MCHC: 34 g/dL (ref 30.0–36.0)
MCV: 92.3 fl (ref 78.0–100.0)
Platelets: 179 K/uL (ref 150.0–400.0)
RBC: 4.41 Mil/uL (ref 4.22–5.81)
RDW: 12.7 % (ref 11.5–15.5)
WBC: 5.8 K/uL (ref 4.0–10.5)

## 2024-06-21 LAB — COMPREHENSIVE METABOLIC PANEL WITH GFR
ALT: 13 U/L (ref 0–53)
AST: 11 U/L (ref 0–37)
Albumin: 5.1 g/dL (ref 3.5–5.2)
Alkaline Phosphatase: 38 U/L — ABNORMAL LOW (ref 39–117)
BUN: 10 mg/dL (ref 6–23)
CO2: 30 meq/L (ref 19–32)
Calcium: 9.3 mg/dL (ref 8.4–10.5)
Chloride: 103 meq/L (ref 96–112)
Creatinine, Ser: 0.88 mg/dL (ref 0.40–1.50)
GFR: 119.97 mL/min (ref >60.00)
Glucose, Bld: 84 mg/dL (ref 70–99)
Potassium: 4.1 meq/L (ref 3.5–5.1)
Sodium: 141 meq/L (ref 135–145)
Total Bilirubin: 0.9 mg/dL (ref 0.2–1.2)
Total Protein: 6.9 g/dL (ref 6.0–8.3)

## 2024-06-21 LAB — LIPID PANEL
Cholesterol: 100 mg/dL (ref 0–200)
HDL: 36 mg/dL — ABNORMAL LOW (ref >39.00)
LDL Cholesterol: 56 mg/dL (ref 0–99)
NonHDL: 63.59
Total CHOL/HDL Ratio: 3
Triglycerides: 40 mg/dL (ref 0.0–149.0)
VLDL: 8 mg/dL (ref 0.0–40.0)

## 2024-06-21 NOTE — Patient Instructions (Addendum)
 Give [place] 2-3 business days to get the results of your labs back.   Keep the diet clean and stay active.  Please get me a copy of your advanced directive form at your convenience.   Do monthly self testicular checks in the shower. You are feeling for lumps/bumps that don't belong. If you feel anything like this, let me know!  Let us know if you need anything.

## 2024-06-21 NOTE — Progress Notes (Signed)
 Chief Complaint  Patient presents with   Annual Exam    CPE    Well Male Marvin Gates is here for a complete physical.   His last physical was >1 year ago.  Current diet: in general, diet is over good.   Current exercise: walking Weight trend: stable Fatigue out of ordinary? No. Seat belt? Yes.   Advanced directive? No  Health maintenance Tetanus- Yes HIV- Yes Hep C- Yes  Past Medical History:  Diagnosis Date   ADHD (attention deficit hyperactivity disorder), inattentive type    Allergic rhinitis    Asthma    Food allergy    High blood pressure      Past Surgical History:  Procedure Laterality Date   TONSILLECTOMY      Medications  Current Outpatient Medications on File Prior to Visit  Medication Sig Dispense Refill   amLODipine  (NORVASC ) 5 MG tablet TAKE 1 TABLET(5 MG) BY MOUTH DAILY 90 tablet 2   buPROPion  (WELLBUTRIN  XL) 150 MG 24 hr tablet Take 1 tablet (150 mg total) by mouth daily. 90 tablet 1   cetirizine  (ZYRTEC ) 10 MG tablet Take 1 tablet (10 mg total) by mouth daily as needed for allergies. 31 tablet 5   EPINEPHrine  (EPIPEN  2-PAK) 0.3 mg/0.3 mL IJ SOAJ injection Use as directed for severe allergic reaction 2 each 2   lisdexamfetamine (VYVANSE ) 50 MG capsule Take 1 capsule (50 mg total) by mouth daily. 02/05/24 30 capsule 0   lisdexamfetamine (VYVANSE ) 50 MG capsule Take 1 capsule (50 mg total) by mouth daily. 30 capsule 0   lisdexamfetamine (VYVANSE ) 50 MG capsule Take 1 capsule (50 mg total) by mouth daily. 30 capsule 0   montelukast  (SINGULAIR ) 10 MG tablet Take 1 tablet (10 mg total) by mouth at bedtime. 90 tablet 1   rosuvastatin  (CRESTOR ) 10 MG tablet TAKE 1 TABLET BY MOUTH EVERY DAY 90 tablet 3   triamcinolone  cream (KENALOG ) 0.1 % Apply 1 Application topically 2 (two) times daily. 30 g 0    Allergies Allergies  Allergen Reactions   Banana Itching    Mouth itches   Wasp Venom Swelling    Family History Family History  Problem Relation Age  of Onset   Asthma Mother    Allergic rhinitis Mother    Food Allergy Mother        shellfish allergy   Diabetes Mother    High blood pressure Mother    Diabetes Father    High blood pressure Father    Angioedema Neg Hx    Eczema Neg Hx    Immunodeficiency Neg Hx    Urticaria Neg Hx     Review of Systems: Constitutional: no fevers or chills Eye:  no recent significant change in vision Ear/Nose/Mouth/Throat:  Ears:  no hearing loss Nose/Mouth/Throat:  no complaints of nasal congestion, no sore throat Cardiovascular:  no chest pain Respiratory:  no shortness of breath Gastrointestinal:  no abdominal pain, no change in bowel habits GU:  Male: negative for dysuria Musculoskeletal/Extremities:  no pain of the joints Integumentary (Skin/Breast):  no abnormal skin lesions reported Neurologic:  no headaches Endocrine: No unexpected weight changes Hematologic/Lymphatic:  no night sweats  Exam BP 122/78 (BP Location: Left Arm, Patient Position: Sitting)   Pulse 79   Temp 98 F (36.7 C) (Oral)   Resp 16   Ht 6' 3 (1.905 m)   Wt 233 lb 9.6 oz (106 kg)   SpO2 98%   BMI 29.20 kg/m  General:  well developed, well nourished, in no apparent distress Skin:  no significant moles, warts, or growths Head:  no masses, lesions, or tenderness Eyes:  pupils equal and round, sclera anicteric without injection Ears:  canals without lesions, TMs shiny without retraction, no obvious effusion, no erythema Nose:  nares patent, mucosa normal Throat/Pharynx:  lips and gingiva without lesion; tongue and uvula midline; non-inflamed pharynx; no exudates or postnasal drainage Neck: neck supple without adenopathy, thyromegaly, or masses Lungs:  clear to auscultation, breath sounds equal bilaterally, no respiratory distress Cardio:  regular rate and rhythm, no bruits, no LE edema Abdomen:  abdomen soft, nontender; bowel sounds normal; no masses or organomegaly Genital (male): Deferred Rectal:  Deferred Musculoskeletal:  symmetrical muscle groups noted without atrophy or deformity Extremities:  no clubbing, cyanosis, or edema, no deformities, no skin discoloration Neuro:  gait normal; deep tendon reflexes normal and symmetric Psych: well oriented with normal range of affect and appropriate judgment/insight  Assessment and Plan  Well adult exam - Plan: CBC, Comprehensive metabolic panel with GFR, Lipid panel   Well 23 y.o. male. Counseled on diet and exercise. Self testicular exams recommended at least monthly.  UDS and CSC updated today.  Flu shot politely declined.  Advanced directive form provided today.  Other orders as above. Follow up in 6 mo pending the above workup. The patient voiced understanding and agreement to the plan.  Mabel [place], DO 06/21/24 9:54 AM

## 2024-06-21 NOTE — Addendum Note (Signed)
 Addended by: Vinetta Brach M on: 06/21/2024 10:08 AM   Modules accepted: Orders

## 2024-06-23 LAB — DRUG MONITORING PANEL 376104, URINE
Amphetamine: 526 ng/mL — ABNORMAL HIGH (ref <250)
Amphetamines: POSITIVE ng/mL — AB (ref <500)
Barbiturates: NEGATIVE ng/mL (ref <300)
Benzodiazepines: NEGATIVE ng/mL (ref <100)
Cocaine Metabolite: NEGATIVE ng/mL (ref <150)
Desmethyltramadol: NEGATIVE ng/mL (ref <100)
Methamphetamine: NEGATIVE ng/mL (ref <250)
Opiates: NEGATIVE ng/mL (ref <100)
Oxycodone: NEGATIVE ng/mL (ref <100)
Tramadol: NEGATIVE ng/mL (ref <100)

## 2024-06-23 LAB — DM TEMPLATE

## 2024-06-28 ENCOUNTER — Other Ambulatory Visit (HOSPITAL_COMMUNITY): Payer: Self-pay

## 2024-06-28 ENCOUNTER — Other Ambulatory Visit: Payer: Self-pay | Admitting: Family Medicine

## 2024-06-28 ENCOUNTER — Other Ambulatory Visit: Payer: Self-pay

## 2024-07-31 ENCOUNTER — Other Ambulatory Visit: Payer: Self-pay | Admitting: Family Medicine

## 2024-07-31 ENCOUNTER — Other Ambulatory Visit (HOSPITAL_COMMUNITY): Payer: Self-pay

## 2024-07-31 MED ORDER — LISDEXAMFETAMINE DIMESYLATE 50 MG PO CAPS
50.0000 mg | ORAL_CAPSULE | Freq: Every day | ORAL | 0 refills | Status: AC
  Filled 2024-07-31: qty 30, 30d supply, fill #0

## 2024-07-31 MED ORDER — LISDEXAMFETAMINE DIMESYLATE 50 MG PO CAPS: 50.0000 mg | ORAL_CAPSULE | Freq: Every day | ORAL | 0 refills | Status: AC

## 2024-07-31 MED ORDER — LISDEXAMFETAMINE DIMESYLATE 50 MG PO CAPS
50.0000 mg | ORAL_CAPSULE | Freq: Every day | ORAL | 0 refills | Status: AC
  Filled 2024-09-16: qty 30, 30d supply, fill #0

## 2024-08-01 ENCOUNTER — Other Ambulatory Visit (HOSPITAL_COMMUNITY): Payer: Self-pay

## 2024-08-01 ENCOUNTER — Other Ambulatory Visit: Payer: Self-pay

## 2024-08-02 ENCOUNTER — Other Ambulatory Visit: Payer: Self-pay

## 2024-09-08 ENCOUNTER — Other Ambulatory Visit: Payer: Self-pay | Admitting: Family Medicine

## 2024-09-12 ENCOUNTER — Other Ambulatory Visit: Payer: Self-pay | Admitting: Family Medicine

## 2024-09-12 ENCOUNTER — Other Ambulatory Visit (HOSPITAL_COMMUNITY): Payer: Self-pay

## 2024-09-12 ENCOUNTER — Other Ambulatory Visit (HOSPITAL_BASED_OUTPATIENT_CLINIC_OR_DEPARTMENT_OTHER): Payer: Self-pay

## 2024-09-13 ENCOUNTER — Other Ambulatory Visit: Payer: Self-pay

## 2024-09-16 ENCOUNTER — Telehealth (HOSPITAL_COMMUNITY): Payer: Self-pay

## 2024-09-16 ENCOUNTER — Other Ambulatory Visit (HOSPITAL_COMMUNITY): Payer: Self-pay

## 2024-09-16 ENCOUNTER — Other Ambulatory Visit: Payer: Self-pay | Admitting: Family Medicine

## 2024-09-16 ENCOUNTER — Telehealth (HOSPITAL_COMMUNITY): Payer: Self-pay | Admitting: Pharmacy Technician

## 2024-09-16 NOTE — Telephone Encounter (Signed)
 Pharmacy Patient Advocate Encounter   Received notification from Pt Calls Messages that prior authorization for Lisdexamfetamine  50 mg capsules is required/requested.   Insurance verification completed.   The patient is insured through [employer].   Per test claim: Per test claim, medication is not covered due to plan/benefit exclusion, PA not submitted at this time  *[facility] requires a trial and failure of Adderall XR first. It does go through on her [facility] insurance but her copay is $113.68

## 2024-09-16 NOTE — Telephone Encounter (Signed)
 PA request has been Received. New Encounter has been or will be created for follow up. For additional info see Pharmacy Prior Auth telephone encounter from 09/16/24.

## 2024-09-16 NOTE — Telephone Encounter (Signed)
 Should have medicine at pharmacy. Thx.

## 2024-09-17 ENCOUNTER — Other Ambulatory Visit (HOSPITAL_BASED_OUTPATIENT_CLINIC_OR_DEPARTMENT_OTHER): Payer: Self-pay

## 2024-09-17 ENCOUNTER — Encounter: Payer: Self-pay | Admitting: Pharmacist

## 2024-09-17 ENCOUNTER — Other Ambulatory Visit: Payer: Self-pay

## 2024-09-17 ENCOUNTER — Other Ambulatory Visit (HOSPITAL_COMMUNITY): Payer: Self-pay

## 2024-09-17 NOTE — Telephone Encounter (Signed)
 Called pt and sent message letting him know it should be at the pharmacy already.

## 2024-10-22 ENCOUNTER — Ambulatory Visit: Admitting: Physician Assistant
